# Patient Record
Sex: Female | Born: 1940 | Race: White | Hispanic: No | Marital: Married | Smoking: Former smoker
Health system: Southern US, Community
[De-identification: ages and names within clinical notes are randomized; demographics above are authoritative.]

## PROBLEM LIST (undated history)

## (undated) DIAGNOSIS — K635 Polyp of colon: Secondary | ICD-10-CM

## (undated) DIAGNOSIS — I480 Paroxysmal atrial fibrillation: Principal | ICD-10-CM

## (undated) HISTORY — DX: Paroxysmal atrial fibrillation: I48.0

## (undated) HISTORY — DX: Polyp of colon: K63.5

---

## 2002-01-05 ENCOUNTER — Encounter (INDEPENDENT_AMBULATORY_CARE_PROVIDER_SITE_OTHER): Payer: Self-pay

## 2002-01-05 ENCOUNTER — Ambulatory Visit (HOSPITAL_COMMUNITY): Admission: RE | Admit: 2002-01-05 | Discharge: 2002-01-05 | Payer: Self-pay | Admitting: Gastroenterology

## 2004-04-06 ENCOUNTER — Encounter: Admission: RE | Admit: 2004-04-06 | Discharge: 2004-04-06 | Payer: Self-pay | Admitting: Family Medicine

## 2004-04-06 ENCOUNTER — Other Ambulatory Visit: Admission: RE | Admit: 2004-04-06 | Discharge: 2004-04-06 | Payer: Self-pay | Admitting: Family Medicine

## 2005-07-18 ENCOUNTER — Other Ambulatory Visit: Admission: RE | Admit: 2005-07-18 | Discharge: 2005-07-18 | Payer: Self-pay | Admitting: Family Medicine

## 2006-02-13 ENCOUNTER — Encounter: Admission: RE | Admit: 2006-02-13 | Discharge: 2006-02-13 | Payer: Self-pay | Admitting: Family Medicine

## 2006-08-19 ENCOUNTER — Encounter: Admission: RE | Admit: 2006-08-19 | Discharge: 2006-08-19 | Payer: Self-pay | Admitting: Family Medicine

## 2006-11-21 ENCOUNTER — Other Ambulatory Visit: Admission: RE | Admit: 2006-11-21 | Discharge: 2006-11-21 | Payer: Self-pay | Admitting: Family Medicine

## 2007-02-17 ENCOUNTER — Encounter: Admission: RE | Admit: 2007-02-17 | Discharge: 2007-02-17 | Payer: Self-pay | Admitting: Family Medicine

## 2008-01-05 ENCOUNTER — Encounter: Admission: RE | Admit: 2008-01-05 | Discharge: 2008-01-05 | Payer: Self-pay | Admitting: Family Medicine

## 2008-03-26 ENCOUNTER — Other Ambulatory Visit: Admission: RE | Admit: 2008-03-26 | Discharge: 2008-03-26 | Payer: Self-pay | Admitting: Family Medicine

## 2008-05-09 ENCOUNTER — Encounter: Admission: RE | Admit: 2008-05-09 | Discharge: 2008-05-09 | Payer: Self-pay | Admitting: Orthopedic Surgery

## 2010-02-09 ENCOUNTER — Emergency Department (HOSPITAL_COMMUNITY): Admission: EM | Admit: 2010-02-09 | Discharge: 2010-02-09 | Payer: Self-pay | Admitting: Emergency Medicine

## 2010-07-05 LAB — GLUCOSE, CAPILLARY: Glucose-Capillary: 91 mg/dL (ref 70–99)

## 2010-09-08 NOTE — Op Note (Signed)
   NAME:  Amber Mccarthy, Amber Mccarthy                      ACCOUNT NO.:  [id number]   MEDICAL RECORD NO.:  [id number]                   PATIENT TYPE:  AMB   LOCATION:  ENDO                                 FACILITY:  [facility]   PHYSICIAN:  Barrie Folk, M.D.                  DATE OF BIRTH:  09/10/1940   DATE OF PROCEDURE:  01/05/2002  DATE OF DISCHARGE:                                 OPERATIVE REPORT   PROCEDURE:  Colonoscopy with polypectomy.   INDICATIONS FOR PROCEDURE:  Family history of colon cancer.   DESCRIPTION OF PROCEDURE:  The patient was placed in the left lateral  decubitus position and placed on the pulse monitor, with continuous low-flow  oxygen delivered by nasal cannula.  She was sedated with 70 mg IV Demerol  and  7.5 mg IV Versed.  The Olympus video colonoscope was inserted into the  rectum and advanced to the cecum, confirmed by transillumination of  McBurney's point and visualization of the ileocecal valve and appendiceal  orifice.  The prep was excellent.  The cecum, ascending, transverse,  descending colon all appeared normal -- with no masses, polyps, diverticula  or other mucosal abnormalities.  In the sigmoid colon there were a few  scattered diverticula.  Within the rectum there was a 1 cm polyp that was  removed by hot biopsy.  The rectum likewise appeared normal.  In retroflex  view the anus revealed no obvious internal hemorrhoids.  The colonoscope was  then withdrawn and the patient returned to the recovery room in stable  condition.  She tolerated the procedure well and there were no immediate  complications.   IMPRESSION:  1. Rectal polyp.  2. Sigmoid diverticula.   PLAN:  Await histology for determination of interval for next colonoscopy.                                                Barrie Folk, M.D.    JCH/MEDQ  D:  01/05/2002  T:  01/05/2002  Job:  [id number]   cc:   Duncan Dull, M.D.  [address]  [place]  [place] [address]  Fax:  [phone]

## 2014-09-22 DIAGNOSIS — I480 Paroxysmal atrial fibrillation: Secondary | ICD-10-CM

## 2014-09-22 HISTORY — DX: Paroxysmal atrial fibrillation: I48.0

## 2014-10-01 ENCOUNTER — Encounter (HOSPITAL_COMMUNITY): Payer: Self-pay | Admitting: *Deleted

## 2014-10-01 ENCOUNTER — Emergency Department (HOSPITAL_COMMUNITY)
Admission: EM | Admit: 2014-10-01 | Discharge: 2014-10-01 | Disposition: A | Discharge status: Home / Self Care | Payer: 59 | Attending: Emergency Medicine | Admitting: Emergency Medicine

## 2014-10-01 DIAGNOSIS — R42 Dizziness and giddiness: Secondary | ICD-10-CM | POA: Diagnosis not present

## 2014-10-01 DIAGNOSIS — R110 Nausea: Secondary | ICD-10-CM | POA: Insufficient documentation

## 2014-10-01 DIAGNOSIS — I4891 Unspecified atrial fibrillation: Secondary | ICD-10-CM | POA: Diagnosis not present

## 2014-10-01 DIAGNOSIS — R630 Anorexia: Secondary | ICD-10-CM | POA: Diagnosis not present

## 2014-10-01 DIAGNOSIS — R000 Tachycardia, unspecified: Secondary | ICD-10-CM | POA: Diagnosis present

## 2014-10-01 DIAGNOSIS — R5383 Other fatigue: Secondary | ICD-10-CM | POA: Diagnosis not present

## 2014-10-01 LAB — CBC WITH DIFFERENTIAL/PLATELET
BASOS ABS: 0 10*3/uL (ref 0.0–0.1)
Basophils Relative: 0 % (ref 0–1)
Eosinophils Absolute: 0.2 10*3/uL (ref 0.0–0.7)
Eosinophils Relative: 3 % (ref 0–5)
HCT: 41.1 % (ref 36.0–46.0)
Hemoglobin: 14.1 g/dL (ref 12.0–15.0)
Lymphocytes Relative: 33 % (ref 12–46)
Lymphs Abs: 2 10*3/uL (ref 0.7–4.0)
MCH: 32.1 pg (ref 26.0–34.0)
MCHC: 34.3 g/dL (ref 30.0–36.0)
MCV: 93.6 fL (ref 78.0–100.0)
Monocytes Absolute: 0.4 10*3/uL (ref 0.1–1.0)
Monocytes Relative: 7 % (ref 3–12)
Neutro Abs: 3.6 10*3/uL (ref 1.7–7.7)
Neutrophils Relative %: 57 % (ref 43–77)
Platelets: 221 10*3/uL (ref 150–400)
RBC: 4.39 MIL/uL (ref 3.87–5.11)
RDW: 12.2 % (ref 11.5–15.5)
WBC: 6.3 10*3/uL (ref 4.0–10.5)

## 2014-10-01 LAB — URINALYSIS, ROUTINE W REFLEX MICROSCOPIC
Bilirubin Urine: NEGATIVE
GLUCOSE, UA: NEGATIVE mg/dL
Hgb urine dipstick: NEGATIVE
Ketones, ur: NEGATIVE mg/dL
Leukocytes, UA: NEGATIVE
Nitrite: NEGATIVE
Protein, ur: NEGATIVE mg/dL
Specific Gravity, Urine: 1.004 — ABNORMAL LOW (ref 1.005–1.030)
Urobilinogen, UA: 0.2 mg/dL (ref 0.0–1.0)
pH: 7.5 (ref 5.0–8.0)

## 2014-10-01 LAB — COMPREHENSIVE METABOLIC PANEL
ALBUMIN: 3.8 g/dL (ref 3.5–5.0)
ALT: 20 U/L (ref 14–54)
AST: 31 U/L (ref 15–41)
Alkaline Phosphatase: 59 U/L (ref 38–126)
Anion gap: 10 (ref 5–15)
BUN: 24 mg/dL — AB (ref 6–20)
CHLORIDE: 107 mmol/L (ref 101–111)
CO2: 25 mmol/L (ref 22–32)
Calcium: 9.1 mg/dL (ref 8.9–10.3)
Creatinine, Ser: 0.84 mg/dL (ref 0.44–1.00)
GFR calc Af Amer: >60 mL/min (ref >60)
GFR calc non Af Amer: >60 mL/min (ref >60)
Glucose, Bld: 108 mg/dL — ABNORMAL HIGH (ref 65–99)
POTASSIUM: 3.7 mmol/L (ref 3.5–5.1)
Sodium: 142 mmol/L (ref 135–145)
Total Bilirubin: 0.9 mg/dL (ref 0.3–1.2)
Total Protein: 6 g/dL — ABNORMAL LOW (ref 6.5–8.1)

## 2014-10-01 LAB — TROPONIN I: Troponin I: <0.03 ng/mL (ref <0.031)

## 2014-10-01 LAB — TSH: TSH: 3.114 u[IU]/mL (ref 0.350–4.500)

## 2014-10-01 MED ORDER — ASPIRIN 81 MG PO CHEW: 81.0000 mg | CHEWABLE_TABLET | Freq: Every day | ORAL | Status: DC

## 2014-10-01 NOTE — ED Notes (Signed)
Pt reports increased stress over the past week. 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

## 2014-10-01 NOTE — ED Notes (Signed)
Pt c/o tachycardia; denies history. Pt was lying in bed around 2230; felt her heart racing, unable to catch her breath, and a nauseated feeling. Pt denies any medical history. EMS EKG strips showed rate of 163 in afib rvr; pt noted to be in NSR in the 80s. Denies chest pain; c/o feeling tired and dry mouth. Water given per MD

## 2014-10-01 NOTE — Discharge Instructions (Signed)
Call and make an appointment to follow-up with a cardiologist. Take a baby aspirin daily. Return immediately for worsening palpitations, chest pain, difficulty breathing, lightheadedness or for any concerns.  Atrial Fibrillation Atrial fibrillation is a type of irregular heart rhythm (arrhythmia). During atrial fibrillation, the upper chambers of the heart (atria) quiver continuously in a chaotic pattern. This causes an irregular and often rapid heart rate.  Atrial fibrillation is the result of the heart becoming overloaded with disorganized signals that tell it to beat. These signals are normally released one at a time by a part of the right atrium called the sinoatrial node. They then travel from the atria to the lower chambers of the heart (ventricles), causing the atria and ventricles to contract and pump blood as they pass. In atrial fibrillation, parts of the atria outside of the sinoatrial node also release these signals. This results in two problems. First, the atria receive so many signals that they do not have time to fully contract. Second, the ventricles, which can only receive one signal at a time, beat irregularly and out of rhythm with the atria.  There are three types of atrial fibrillation:   Paroxysmal. Paroxysmal atrial fibrillation starts suddenly and stops on its own within a week.  Persistent. Persistent atrial fibrillation lasts for more than a week. It may stop on its own or with treatment.  Permanent. Permanent atrial fibrillation does not go away. Episodes of atrial fibrillation may lead to permanent atrial fibrillation. Atrial fibrillation can prevent your heart from pumping blood normally. It increases your risk of stroke and can lead to heart failure.  CAUSES   Heart conditions, including a heart attack, heart failure, coronary artery disease, and heart valve conditions.   Inflammation of the sac that surrounds the heart (pericarditis).  Blockage of an artery in the  lungs (pulmonary embolism).  Pneumonia or other infections.  Chronic lung disease.  Thyroid problems, especially if the thyroid is overactive (hyperthyroidism).  Caffeine, excessive alcohol use, and use of some illegal drugs.   Use of some medicines, including certain decongestants and diet pills.  Heart surgery.   Birth defects.  Sometimes, no cause can be found. When this happens, the atrial fibrillation is called lone atrial fibrillation. The risk of complications from atrial fibrillation increases if you have lone atrial fibrillation and you are age 46 years or older. RISK FACTORS  Heart failure.  Coronary artery disease.  Diabetes mellitus.   High blood pressure (hypertension).   Obesity.   Other arrhythmias.   Increased age. SIGNS AND SYMPTOMS   A feeling that your heart is beating rapidly or irregularly.   A feeling of discomfort or pain in your chest.   Shortness of breath.   Sudden light-headedness or weakness.   Getting tired easily when exercising.   Urinating more often than normal (mainly when atrial fibrillation first begins).  In paroxysmal atrial fibrillation, symptoms may start and suddenly stop. DIAGNOSIS  Your health care provider may be able to detect atrial fibrillation when taking your pulse. Your health care provider may have you take a test called an ambulatory electrocardiogram (ECG). An ECG records your heartbeat patterns over a 24-hour period. You may also have other tests, such as:  Transthoracic echocardiogram (TTE). During echocardiography, sound waves are used to evaluate how blood flows through your heart.  Transesophageal echocardiogram (TEE).  Stress test. There is more than one type of stress test. If a stress test is needed, ask your health care provider about which  type is best for you.  Chest X-ray exam.  Blood tests.  Computed tomography (CT). TREATMENT  Treatment may include:  Treating any underlying  conditions. For example, if you have an overactive thyroid, treating the condition may correct atrial fibrillation.  Taking medicine. Medicines may be given to control a rapid heart rate or to prevent blood clots, heart failure, or a stroke.  Having a procedure to correct the rhythm of the heart:  Electrical cardioversion. During electrical cardioversion, a controlled, low-energy shock is delivered to the heart through your skin. If you have chest pain, very low blood pressure, or sudden heart failure, this procedure may need to be done as an emergency.  Catheter ablation. During this procedure, heart tissues that send the signals that cause atrial fibrillation are destroyed.  Surgical ablation. During this surgery, thin lines of heart tissue that carry the abnormal signals are destroyed. This procedure can either be an open-heart surgery or a minimally invasive surgery. With the minimally invasive surgery, small cuts are made to access the heart instead of a large opening.  Pulmonary venous isolation. During this surgery, tissue around the veins that carry blood from the lungs (pulmonary veins) is destroyed. This tissue is thought to carry the abnormal signals. HOME CARE INSTRUCTIONS   Take medicines only as directed by your health care provider. Some medicines can make atrial fibrillation worse or recur.  If blood thinners were prescribed by your health care provider, take them exactly as directed. Too much blood-thinning medicine can cause bleeding. If you take too little, you will not have the needed protection against stroke and other problems.  Perform blood tests at home if directed by your health care provider. Perform blood tests exactly as directed.  Quit smoking if you smoke.  Do not drink alcohol.  Do not drink caffeinated beverages such as coffee, soda, and some teas. You may drink decaffeinated coffee, soda, or tea.   Maintain a healthy weight.Do not use diet pills unless  your health care provider approves. They may make heart problems worse.   Follow diet instructions as directed by your health care provider.  Exercise regularly as directed by your health care provider.  Keep all follow-up visits as directed by your health care provider. This is important. PREVENTION  The following substances can cause atrial fibrillation to recur:   Caffeinated beverages.  Alcohol.  Certain medicines, especially those used for breathing problems.  Certain herbs and herbal medicines, such as those containing ephedra or ginseng.  Illegal drugs, such as cocaine and amphetamines. Sometimes medicines are given to prevent atrial fibrillation from recurring. Proper treatment of any underlying condition is also important in helping prevent recurrence.  SEEK MEDICAL CARE IF:  You notice a change in the rate, rhythm, or strength of your heartbeat.  You suddenly begin urinating more frequently.  You tire more easily when exerting yourself or exercising. SEEK IMMEDIATE MEDICAL CARE IF:   You have chest pain, abdominal pain, sweating, or weakness.  You feel nauseous.  You have shortness of breath.  You suddenly have swollen feet and ankles.  You feel dizzy.  Your face or limbs feel numb or weak.  You have a change in your vision or speech. MAKE SURE YOU:   Understand these instructions.  Will watch your condition.  Will get help right away if you are not doing well or get worse. Document Released: 04/09/2005 Document Revised: 08/24/2013 Document Reviewed: 05/20/2012 [facility] Patient Information 2015 [place], [place]. This information is not intended  to replace advice given to you by your health care provider. Make sure you discuss any questions you have with your health care provider. ° °

## 2014-10-01 NOTE — ED Provider Notes (Signed)
CSN: [id number]     Arrival date & time 10/01/14  0038 History  This chart was scribed for Amber Racer, MD by Amber Mccarthy, ED Scribe. This patient was seen in room A01C/A01C and the patient's care was started at 12:47 AM.   Chief Complaint  Patient presents with  . Tachycardia    The history is provided by the patient. No language interpreter was used.     HPI Comments: Amber Mccarthy is a 74 y.o. female brought in by ambulance, who presents to the Emergency Department complaining of sudden onset palpitations that began approximately 2 hours ago, shortly after going to bed at 2230. Described the palpitations as rapid heartbeat. Patient has tried switching positions in bed, in addition to ambulating without relief. There is associated dry mouth, light headedness, dizziness, and nausea; all of which have resolved at this time. Patient states she is very active and athletic and denies history of prior similar episodes. Patient has recently been working 9-10 hour days and has not been eating well and feels dehydrated. HR was 129 upon arrival of EMS. No chest pain or shortness of breath.   History reviewed. No pertinent past medical history. Past Surgical History  Procedure Laterality Date  . Cesarean section     History reviewed. No pertinent family history. History  Substance Use Topics  . Smoking status: Never Smoker   . Smokeless tobacco: Not on file  . Alcohol Use: Yes   OB History    No data available     Review of Systems  Constitutional: Positive for fatigue. Negative for fever and chills.  Respiratory: Negative for cough and shortness of breath.   Cardiovascular: Positive for palpitations. Negative for chest pain and leg swelling.  Gastrointestinal: Positive for nausea. Negative for vomiting, abdominal pain and diarrhea.  Genitourinary: Negative for dysuria.  Musculoskeletal: Negative for back pain, neck pain and neck stiffness.  Skin: Negative for rash and  wound.  Neurological: Positive for dizziness and light-headedness. Negative for speech difficulty, weakness and headaches.  All other systems reviewed and are negative.     Allergies  Review of patient's allergies indicates no known allergies.  Home Medications   Prior to Admission medications   Medication Sig Start Date End Date Taking? Authorizing Provider  Nutritional Supplements (ESTRONATURAL PO) Take 1 tablet by mouth at bedtime.   Yes Historical Provider, MD  aspirin 81 MG chewable tablet Chew 1 tablet (81 mg total) by mouth daily. 10/01/14   Amber Racer, MD   BP 124/62 mmHg  Pulse 68  Temp(Src) 98.4 F (36.9 C) (Oral)  Resp 16  Ht  (1.651 m)  Wt 140 lb (63.504 kg)  BMI 23.30 kg/m2  SpO2 97% Physical Exam  Constitutional: She is oriented to person, place, and time. She appears well-developed and well-nourished. No distress.  HENT:  Head: Normocephalic and atraumatic.  Mouth/Throat: Oropharynx is clear and moist. No oropharyngeal exudate.  Eyes: EOM are normal. Pupils are equal, round, and reactive to light.  Neck: Normal range of motion. Neck supple. No thyromegaly present.  Cardiovascular: Normal rate and regular rhythm.  Exam reveals no gallop and no friction rub.   No murmur heard. Pulmonary/Chest: Effort normal and breath sounds normal. No respiratory distress. She has no wheezes. She has no rales. She exhibits no tenderness.  Abdominal: Soft. Bowel sounds are normal. She exhibits no distension and no mass. There is no tenderness. There is no rebound and no guarding.  Musculoskeletal: Normal range of motion.  She exhibits no edema or tenderness.  No calf swelling or tenderness.  Neurological: She is alert and oriented to person, place, and time.  5/5 motor in all extremities. Sensation is fully intact.  Skin: Skin is warm and dry. No rash noted. No erythema.  Psychiatric: She has a normal mood and affect. Her behavior is normal.  Nursing note and vitals  reviewed.   ED Course  Procedures (including critical care time)  DIAGNOSTIC STUDIES: Oxygen Saturation is 98% on room air, normal by my interpretation.    COORDINATION OF CARE:   Labs Review Labs Reviewed  COMPREHENSIVE METABOLIC PANEL - Abnormal; Notable for the following:    Glucose, Bld 108 (*)    BUN 24 (*)    Total Protein 6.0 (*)    All other components within normal limits  URINALYSIS, ROUTINE W REFLEX MICROSCOPIC (NOT AT [facility]) - Abnormal; Notable for the following:    Specific Gravity, Urine 1.004 (*)    All other components within normal limits  CBC WITH DIFFERENTIAL/PLATELET  TROPONIN I  TSH    Imaging Review No results found.   EKG Interpretation   Date/Time:  Friday October 01 2014 00:56:51 EDT Ventricular Rate:  87 PR Interval:  141 QRS Duration: 79 QT Interval:  354 QTC Calculation: 426 R Axis:   64 Text Interpretation:  Age not entered, assumed to be  74 years old for  purpose of ECG interpretation Sinus rhythm Borderline T abnormalities,  inferior leads Confirmed by Amber Palms  MD, Amber Mccarthy ([id number]) on 10/01/2014  3:15:24 AM      MDM   Final diagnoses:  New onset atrial fibrillation    I personally performed the services described in this documentation, which was scribed in my presence. The recorded information has been reviewed and is accurate.  EMS run sheet showed atrial fibrillation with RVR. Patient is obviously converted to normal sinus in the emergency department. She's been observed for several hours and her symptoms completely resolved. At no point did she have any chest pressure or pain. She has CHA2DS2-VASc score of 2 for age and gender.  She is low risk for stroke and briefly discussed anticoagulation with her. Will start on baby aspirin and have her follow-up with the cardiologist. She's been given return precautions and is voiced understanding.  Amber Racer, MD 10/01/14 [phone]

## 2014-10-01 NOTE — ED Notes (Signed)
Pt. Left with all belongings and refused wheelchair 

## 2014-10-01 NOTE — ED Notes (Signed)
Pt arrived via EMS c/o tachycardia, SOB, and dry mouth.  EMS reported highest heart rate was in the 160's.  Pt reports has no medical hx, takes no prescription meds at home.

## 2014-10-04 DIAGNOSIS — I480 Paroxysmal atrial fibrillation: Secondary | ICD-10-CM | POA: Insufficient documentation

## 2014-10-04 NOTE — Progress Notes (Signed)
Cardiology Office Note   Date:  10/05/2014   ID:  Amber Mccarthy, DOB 30-Aug-1940, MRN [id number]  PCP:  Marjorie Smolder, MD  Cardiologist:  New - Dr. Sherren Mocha     Chief Complaint  Patient presents with  . Hospitalization Follow-up    Atrial Fibrillation; ED visit     History of Present Illness: Amber Mccarthy is a 74 y.o. female who presented to the ED on 10/01/14 with palpitations. Notes from the emergency room physician suggested there was atrial fibrillation noted on rhythm strip during her ride to the hospital on the ambulance. ECG in the ED demonstrated normal sinus rhythm.  CEs were neg.  TSH was normal.  She presents for further cardiac evaluation.     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.  She is now retired. She is very healthy and is quite active. She exercises weekly.  She does Yoga, Tai-Chi, swimming and weight training. She denies any hx of chest pain or significant DOE. She denies orthopnea, PND, edema. She denies syncope.  She has occasional palpitations but nothing like she did the day she went to the ED.  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   She was not eating well and not hydrating well.  She denies OTC medication use.  She drinks 1 glass of wine per night.  She has a remote hx of smoking.  No significant FHx of CAD. She denies a hx of Diabetes, HTN, prior CVA or CHF.    Studies/Reports Reviewed Today:  None   Past Medical History  Diagnosis Date  . PAF (paroxysmal atrial fibrillation) 09/2014  .  Colon polyps     Past Surgical History  Procedure Laterality Date  . Cesarean section      Current Outpatient Prescriptions  Medication Sig Dispense Refill  . aspirin 81 MG chewable tablet Chew 1 tablet (81 mg total) by mouth daily. 30 tablet 0  . Nutritional Supplements (ESTRONATURAL PO) Take 1 tablet by mouth at bedtime.    . metoprolol tartrate (LOPRESSOR) 25 MG tablet Take 0.5 tablets (12.5 mg total) by mouth daily as needed (FOR PALPITATIONS). 60 tablet 11   No current facility-administered medications for this visit.    Allergies:   Review of patient's allergies indicates no known allergies.    Social History:  The patient  reports that she has quit smoking. She has never used smokeless tobacco. She reports that she drinks about 4.2 oz of alcohol per week. She reports that she does not use illicit drugs.   Family History:  The patient's family history includes Alzheimer's disease in her father; Cancer in her mother.    ROS:   Please see the history of present illness.   Review of Systems  Cardiovascular: Positive for dyspnea on exertion and irregular heartbeat.  All other systems reviewed and are negative.    PHYSICAL EXAM: VS:  BP 120/60 mmHg  Pulse 89  Ht 5' 5" (1.651 m)  Wt 139 lb 1.9 oz (63.104 kg)  BMI 23.15 kg/m2    Wt Readings from Last 3 Encounters:  10/05/14 139 lb 1.9 oz (63.104 kg)  10/01/14 140 lb (63.504 kg)     GEN: Well nourished, well developed, in no acute distress HEENT: normal Neck: no JVD, no carotid bruits, no masses Cardiac:  Normal S1/S2, RRR; no murmur ,  no rubs or gallops, no edema   Respiratory:  clear to auscultation bilaterally, no wheezing, rhonchi or rales. GI: soft, nontender, nondistended, + BS MS: no deformity or atrophy Skin: warm and dry  Neuro:  CNs II-XII intact, Strength and sensation are intact Psych: Normal affect   EKG:  EKG is ordered today.  It demonstrates:   NSR, HR 89, normal axis ECG from EMS on 10/01/14:  AFib, HR 163  Recent Labs: 10/01/2014: ALT 20; BUN 24*; Creatinine, Ser 0.84; Hemoglobin 14.1; Platelets 221; Potassium 3.7; Sodium 142; TSH 3.114    Lipid Panel No results found for: CHOL, TRIG, HDL, CHOLHDL, VLDL, LDLCALC, LDLDIRECT    ASSESSMENT AND PLAN:  PAF (paroxysmal atrial fibrillation):  She presents for evaluation after a lone episode of AFib with RVR.  She was very symptomatic with this episode.  She has been a little weak since that time, but is feeling better.  I spent > 20 minutes with the patient and her husband today discussing the pathophysiology and management options for Atrial Fibrillation.  CHADS2-VASc=2 representing annual stroke risk of 2.2%.  TSH was normal.  There are no specific triggers identified for this episode of AFib.  It is possible that her fatigue and dehydration may have contributed. Technically, she would be a candidate for anticoagulation. However, at this point, I am not convinced she should be started on long term anticoagulation just yet.  Continue ASA for now.  I will give her Metoprolol Tartrate 25 mg take 1/2 QD as needed for rapid palpitations.  I will arrange an Echocardiogram to rule out structural heart disease.   We discussed arranging a 30 day monitor to assess for recurrent AFib and AF burden.  However, she is going to be traveling quite a bit and does not feel that she can comply with wearing it the entire 30 days.  We will therefore arrange a 48 hour Holter.  Question if she may be a candidate for GENETIC AF trial.  I reviewed her case with Dr. Michael Cooper today.  He also met with the patient.  We will have her follow up in the AFib Clinic to make further recommendations, if any.       Current medicines are reviewed at length with the patient today.  Concerns regarding medicines are as outlined above.  The following changes have been made:    As above  Labs/ tests ordered today include:   Orders Placed This Encounter  Procedures  . Holter  monitor - 48 hour  . EKG 12-Lead  . Echocardiogram    Disposition:   Refer to Atrial Fibrillation Clinic.  She can follow up after her monitor is complete.    Signed, Scott Weaver, PA-C, MHS 10/05/2014 4:10 PM    [facility] [address], [place], [place]  [address] Phone: [phone]; Fax: [phone]    

## 2014-10-05 ENCOUNTER — Ambulatory Visit (INDEPENDENT_AMBULATORY_CARE_PROVIDER_SITE_OTHER): Payer: 59 | Admitting: Physician Assistant

## 2014-10-05 ENCOUNTER — Encounter: Payer: Self-pay | Admitting: Physician Assistant

## 2014-10-05 VITALS — BP 120/60 | HR 89 | Ht 65.0 in | Wt 139.1 lb

## 2014-10-05 DIAGNOSIS — R002 Palpitations: Secondary | ICD-10-CM

## 2014-10-05 DIAGNOSIS — I480 Paroxysmal atrial fibrillation: Secondary | ICD-10-CM

## 2014-10-05 MED ORDER — METOPROLOL TARTRATE 25 MG PO TABS: 12.5000 mg | ORAL_TABLET | Freq: Two times a day (BID) | ORAL | Status: DC

## 2014-10-05 MED ORDER — METOPROLOL TARTRATE 25 MG PO TABS
12.5000 mg | ORAL_TABLET | Freq: Every day | ORAL | Status: DC | PRN
Start: 2014-10-05 — End: 2017-09-09

## 2014-10-05 NOTE — Patient Instructions (Addendum)
Medication Instructions:  1. START METOPROLOL TARTRATE 25 MG TAB; DIRECTIONS TAKE 1/2 TAB= 12.5 MG DAILY AS NEEDED  Labwork: NONE  Testing/Procedures: Your physician has requested that you have an echocardiogram. Echocardiography is a painless test that uses sound waves to create images of your heart. It provides your doctor with information about the size and shape of your heart and how well your heart's chambers and valves are working. This procedure takes approximately one hour. There are no restrictions for this procedure.  Your physician has recommended that you wear a 48 HOUR holter monitor. Holter monitors are medical devices that record the heart's electrical activity. Doctors most often use these monitors to diagnose arrhythmias. Arrhythmias are problems with the speed or rhythm of the heartbeat. The monitor is a small, portable device. You can wear one while you do your normal daily activities. This is usually used to diagnose what is causing palpitations/syncope (passing out).  Follow-Up: YOU ARE BEING REFERRED TO A-FIB CLINIC FOR APPT IN 6 WEEKS  Any Other Special Instructions Will Be Listed Below (If Applicable).

## 2014-10-07 ENCOUNTER — Ambulatory Visit (INDEPENDENT_AMBULATORY_CARE_PROVIDER_SITE_OTHER): Payer: Medicare Other

## 2014-10-07 ENCOUNTER — Encounter: Payer: Self-pay | Admitting: Physician Assistant

## 2014-10-07 ENCOUNTER — Other Ambulatory Visit: Payer: Self-pay

## 2014-10-07 ENCOUNTER — Ambulatory Visit (HOSPITAL_COMMUNITY): Payer: Medicare Other | Attending: Internal Medicine

## 2014-10-07 DIAGNOSIS — I480 Paroxysmal atrial fibrillation: Secondary | ICD-10-CM | POA: Diagnosis not present

## 2014-10-08 ENCOUNTER — Telehealth: Payer: Self-pay | Admitting: Physician Assistant

## 2014-10-08 NOTE — Telephone Encounter (Signed)
Results of echo given to patient as specified per [facility].  Understands.  No further questions

## 2014-10-08 NOTE — Telephone Encounter (Signed)
New problem    Pt returning call from yesterday concerning lab results.

## 2014-10-13 ENCOUNTER — Telehealth: Payer: Self-pay | Admitting: Physician Assistant

## 2014-10-13 NOTE — Telephone Encounter (Signed)
Holter Monitor Results:   Interpretation Summary     CONCLUSIONS:  THE BASIC RHYTHM IS SINUS  THERE ARE RARE PVC'S  THERE ARE SHORT RUNS OF CONSECUTIVE SUPRAVENTRICULAR BEATS OF 4-5 BEATS  NO SUSTAINED ARRHYTHMIA OR PATHOLOGIC PAUSES   Please tell patient that monitor demonstrated no AFib.  There were a few rapid beats but these were very brief (4-5 beats).  There is nothing to do for this.   Continue with current treatment plan. Tereso Newcomer, PA-C   10/13/2014 4:30 PM

## 2014-10-15 ENCOUNTER — Telehealth: Payer: Self-pay | Admitting: *Deleted

## 2014-10-15 NOTE — Telephone Encounter (Signed)
Pt notified of monitor resy=ults today by phone w/verbal understanding to results given. Pt advised to keep her appt with the A-fib clinic in July. Pt agreeable to plan of care.

## 2014-10-15 NOTE — Telephone Encounter (Signed)
Pt notified of monitor resy=ults today by phone w/verbal understanding to results given. Pt advised to keep her appt with the A-fib clinic in July. Pt agreeable to plan of care. 

## 2014-11-17 ENCOUNTER — Encounter: Payer: Self-pay | Admitting: Internal Medicine

## 2014-11-17 ENCOUNTER — Ambulatory Visit (INDEPENDENT_AMBULATORY_CARE_PROVIDER_SITE_OTHER): Payer: Medicare Other | Admitting: Internal Medicine

## 2014-11-17 VITALS — BP 126/74 | HR 88 | Ht 65.5 in | Wt 140.6 lb

## 2014-11-17 DIAGNOSIS — I480 Paroxysmal atrial fibrillation: Secondary | ICD-10-CM

## 2014-11-17 MED ORDER — APIXABAN 5 MG PO TABS: 5.0000 mg | ORAL_TABLET | Freq: Two times a day (BID) | ORAL | Status: DC

## 2014-11-17 NOTE — Patient Instructions (Signed)
Medication Instructions:  Your physician has recommended you make the following change in your medication:  1) Start Eliquis 5 mg one by mouth twice daily    Labwork: None ordered  Testing/Procedures: None ordered  Follow-Up: Your physician recommends that you schedule a follow-up appointment in: 6 weeks with Dr [name]   Any Other Special Instructions Will Be Listed Below (If Applicable).

## 2014-11-17 NOTE — Progress Notes (Signed)
Electrophysiology Office Note   Date:  11/17/2014   ID:  Amber Mccarthy, DOB [birth year], MRN [id number]  PCP:  Marjorie Smolder, MD  Cardiologist:  Burt Knack Primary Electrophysiologist: Thompson Grayer, MD    Chief Complaint  Patient presents with  . PAF     History of Present Illness: Amber Mccarthy is a 74 y.o. female who presents today for electrophysiology evaluation.   DAIANA Mccarthy is a 74 y.o. female who presented to the ED on 10/01/14 with palpitations. Notes from the emergency room physician suggested there was atrial fibrillation noted on rhythm strip during her ride to the hospital on the ambulance. ECG in the ED demonstrated normal sinus rhythm. CEs were neg. TSH was normal. She presents for further cardiac evaluation.    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. She is now retired. She is very healthy and is quite active. She exercises weekly. She does Yoga, Tai-Chi, swimming and weight training. She denies any hx of chest pain or significant DOE. She denies orthopnea, PND, edema. She denies syncope. She has occasional palpitations but nothing like she did the day she went to the ED. 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 She was not eating well and not hydrating well. She denies OTC medication use. She drinks 1 glass of wine per night. She has a remote hx of smoking. No significant FHx of CAD. She denies a hx of Diabetes, HTN, prior CVA or CHF. She has had fatigue over the past month as well as several short episodes of irregular palpitations, likely due to afib.   Today, she denies symptoms of  chest pain, shortness of breath, orthopnea, PND, lower extremity edema, claudication, dizziness, presyncope, syncope, bleeding, or neurologic sequela. The patient is tolerating medications without difficulties and is otherwise without complaint today.     Past Medical History  Diagnosis Date  . PAF (paroxysmal atrial fibrillation) 09/2014    a.  Echo 6/16:  Vigorous LVF, EF 65-70%, no RWMA, Gr 1 DD  . Colon polyps    Past Surgical History  Procedure Laterality Date  . Cesarean section  1987     Current Outpatient Prescriptions  Medication Sig Dispense Refill  . aspirin 81 MG chewable tablet Chew 1 tablet (81 mg total) by mouth daily. 30 tablet 0  . CALCIUM-MAGNESIUM-VITAMIN D PO Take 2 capsules by mouth daily.     . Multiple Vitamin (MULTIVITAMIN) capsule Take 1 capsule by mouth daily. Ultra Women 50+    . omega-3 fish oil (MAXEPA) 1000 MG CAPS capsule Take 1 capsule by mouth daily.    . vitamin B-12 (CYANOCOBALAMIN) 1000 MCG tablet Take 1,000 mcg by mouth daily.    . metoprolol tartrate (LOPRESSOR) 25 MG tablet Take 0.5 tablets (12.5 mg total) by mouth daily as needed (FOR PALPITATIONS). (Patient not taking: Reported on 11/17/2014) 60 tablet 11   No current facility-administered medications for this visit.    Allergies:   Review of patient's allergies indicates no known allergies.   Social History:  The patient  reports that she has quit smoking. She has never used smokeless tobacco. She reports that she drinks about 4.2 oz of alcohol per week. She reports that she does not use illicit drugs.   Family History:  The patient's  family history includes Alzheimer's disease in her father; Cancer in her mother.  ROS:  Please see the history of present illness.   All other systems are reviewed and negative.    PHYSICAL EXAM: VS:  BP 126/74 mmHg  Pulse 88  Ht 5' 5.5" (1.664 m)  Wt 63.776 kg (140 lb 9.6 oz)  BMI 23.03 kg/m2 , BMI Body mass index is 23.03 kg/(m^2). GEN: Well nourished, well developed, in no acute distress HEENT: normal Neck: no JVD, carotid bruits, or masses Cardiac: RRR; no murmurs, rubs, or gallops,no edema  Respiratory:  clear to auscultation bilaterally, normal work of breathing GI: soft, nontender,  nondistended, + BS MS: no deformity or atrophy Skin: warm and dry  Neuro:  Strength and sensation are intact Psych: euthymic mood, full affect   Recent Labs: 10/01/2014: ALT 20; BUN 24*; Creatinine, Ser 0.84; Hemoglobin 14.1; Platelets 221; Potassium 3.7; Sodium 142; TSH 3.114    Lipid Panel  No results found for: CHOL, TRIG, HDL, CHOLHDL, VLDL, LDLCALC, LDLDIRECT   Wt Readings from Last 3 Encounters:  11/17/14 63.776 kg (140 lb 9.6 oz)  10/05/14 63.104 kg (139 lb 1.9 oz)  10/01/14 63.504 kg (140 lb)      Other studies Reviewed: Additional studies/ records that were reviewed today include: Marine scientist notes, Echo  Review of the above records today demonstrates: preserved EF holter reveals pvcs, short atrial runs,  No afib  ASSESSMENT AND PLAN:  1.  Atrial fibrillation chads2vasc score is 2.  Today, I discussed Coumadin as well as novel anticoagulants including Pradaxa, Xarelto, Savaysa, and Eliquis today as indicated for risk reduction in stroke and systemic emboli with nonvalvular atrial fibrillation.  Risks, benefits, and alternatives to each of these drugs were discussed at length today.  She would like to start eliquis. I will therefore start eliquis 58m BID.  Stop asa I would reserve AAD therapy for worsening symptoms. Regular activity is encouraged  Follow-up:  Return to see me in 6 weeks for further discussion  Current medicines are reviewed at length with the patient today.   The patient does not have concerns regarding her medicines.  The following changes were made today:  none   Signed, JThompson Grayer MD  11/17/2014 2:47 PM     [place] [phone](office) [phone](fax)

## 2014-11-19 ENCOUNTER — Telehealth: Payer: Self-pay

## 2014-11-19 NOTE — Telephone Encounter (Signed)
Prior auth for Eliquis  sent to [facility] via fax. Patient called and was advised of this.

## 2014-11-22 ENCOUNTER — Telehealth: Payer: Self-pay

## 2014-11-22 NOTE — Telephone Encounter (Signed)
Samples of Eliquis 5 mg given, 2 boxes while awaiting PA.

## 2014-11-23 ENCOUNTER — Telehealth: Payer: Self-pay

## 2014-11-23 NOTE — Telephone Encounter (Signed)
Eliquis approved through [facility], good for 1 year till 11/22/2015. PA [id number]. Local Pharmacy notified.

## 2014-11-25 ENCOUNTER — Ambulatory Visit (HOSPITAL_COMMUNITY)
Admission: RE | Admit: 2014-11-25 | Discharge: 2014-11-25 | Disposition: A | Discharge status: Home / Self Care | Payer: Medicare Other | Source: Ambulatory Visit | Attending: Nurse Practitioner | Admitting: Nurse Practitioner

## 2014-11-25 VITALS — BP 120/80 | HR 70 | Ht 65.0 in | Wt 141.8 lb

## 2014-11-25 DIAGNOSIS — Z87891 Personal history of nicotine dependence: Secondary | ICD-10-CM | POA: Insufficient documentation

## 2014-11-25 DIAGNOSIS — Z7902 Long term (current) use of antithrombotics/antiplatelets: Secondary | ICD-10-CM | POA: Insufficient documentation

## 2014-11-25 DIAGNOSIS — Z79899 Other long term (current) drug therapy: Secondary | ICD-10-CM | POA: Diagnosis not present

## 2014-11-25 DIAGNOSIS — R002 Palpitations: Secondary | ICD-10-CM | POA: Diagnosis not present

## 2014-11-25 DIAGNOSIS — I480 Paroxysmal atrial fibrillation: Secondary | ICD-10-CM | POA: Diagnosis present

## 2014-11-26 ENCOUNTER — Encounter (HOSPITAL_COMMUNITY): Payer: Self-pay | Admitting: Nurse Practitioner

## 2014-11-26 NOTE — Progress Notes (Signed)
Patient ID: Amber Mccarthy, female   DOB: 04/20/1941, 74 y.o.   MRN: [id number]     Primary Care Physician: Hollice Espy, MD Referring Physician: self referred Electrophysiologist: Dr. Raelyn Ensign is a 74 y.o. female with a h/o new onset afib with ER visit 6/10. She was aware of a very rapid heart rhythm around 160 bpm. EMS was called and per pt it was documented on EMS strips, however by the time she arrived at the ER, rhythm has returned to sinus. She recently saw Dr. [name] and was placed on DOAC,(chadsvasc score of 2) has prn BB to use for elevated heart rate.  She requested to be seen in the Afib clinic to discuss further symptoms she has been experiencing. She calls thies mini episodes and they feel much different than the afib. What she describes sounds more consistent with palpitations and a recent monitor did show rare PVC's and 4-5 beat runs of SVT. She is very concerned about these and is very upset that she considers herself healthy and cannot believe she is having heart issues. She is very much a holistic health person and does not like prescription drugs but has no qualms taking supplements. She assures me that her current supplements do not contain stimulants. She also recented stopped taking an OTC estrogen supplement because it contained black cohosh, contraindicated with DOAC and believes she is having more hot flashes this could  be contributing to palpitations. These episodes are very short lived 5 mins to 30 mins and if she gets her mind off them they will go away but they are very concerning and frightening to her.Seh ahs cut out caffeine and she use to drink a glass of wine a mnight and is trying to cut down. She is very active, regular exerciser, and in good shape. No noring.  Today, she denies symptoms of chest pain, shortness of breath, orthopnea, PND, lower extremity edema, dizziness, presyncope, syncope, or neurologic sequela. The patient is tolerating  medications without difficulties and is otherwise without complaint today.   Past Medical History  Diagnosis Date  . PAF (paroxysmal atrial fibrillation) 09/2014    a.  Echo 6/16:  Vigorous LVF, EF 65-70%, no RWMA, Gr 1 DD  . Colon polyps    Past Surgical History  Procedure Laterality Date  . Cesarean section  1987    Current Outpatient Prescriptions  Medication Sig Dispense Refill  . apixaban (ELIQUIS) 5 MG TABS tablet Take 1 tablet (5 mg total) by mouth 2 (two) times daily. 60 tablet 11  . CALCIUM-MAGNESIUM-VITAMIN D PO Take 2 capsules by mouth daily.     . Multiple Vitamin (MULTIVITAMIN) capsule Take 1 capsule by mouth daily. Ultra Women 50+    . omega-3 fish oil (MAXEPA) 1000 MG CAPS capsule Take 1 capsule by mouth daily.    . vitamin B-12 (CYANOCOBALAMIN) 1000 MCG tablet Take 1,000 mcg by mouth daily.    Marland Kitchen aspirin 81 MG chewable tablet Chew 1 tablet (81 mg total) by mouth daily. (Patient not taking: Reported on 11/25/2014) 30 tablet 0  . metoprolol tartrate (LOPRESSOR) 25 MG tablet Take 0.5 tablets (12.5 mg total) by mouth daily as needed (FOR PALPITATIONS). (Patient not taking: Reported on 11/17/2014) 60 tablet 11   No current facility-administered medications for this encounter.    No Known Allergies  History   Social History  . Marital Status: Married    Spouse Name: [name]  . Number of Children: 1  .  Years of Education: N/A   Occupational History  . speech pathologist     Retired from [employer]   Social History Main Topics  . Smoking status: Former Games developer  . Smokeless tobacco: Never Used     Comment: quit 1970s  . Alcohol Use: 4.2 oz/week    7 Standard drinks or equivalent per week     Comment: 1 glass of wine per night--> since afib she has decreased  . Drug Use: No  . Sexual Activity: Not on file   Other Topics Concern  . Not on file   Social History Narrative   Pt lives in [place] with spouse.  Retired.  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    Family History  Problem Relation Age of Onset  . Cancer Mother     angiosarcoma  . Alzheimer's disease Father     ROS- All systems are reviewed and negative except as per the HPI above  Physical Exam: Filed Vitals:   11/25/14 1532  BP: 120/80  Pulse: 70  Height: 5\' 5"  (1.651 m)  Weight: 141 lb 12.8 oz (64.32 kg)    GEN- The patient is well appearing, alert and oriented x 3 today.   Head- normocephalic, atraumatic Eyes-  Sclera clear, conjunctiva pink Ears- hearing intact Oropharynx- clear Neck- supple, no JVP Lymph- no cervical lymphadenopathy Lungs- Clear to ausculation bilaterally, normal work of breathing Heart- Regular rate and rhythm, no murmurs, rubs or gallops, PMI not laterally displaced GI- soft, NT, ND, + BS Extremities- no clubbing, cyanosis, or edema MS- no significant deformity or atrophy Skin- no rash or lesion Psych- euthymic mood, full affect Neuro- strength and sensation are intact  EKG- None today. Recent EKG's reviewed.  ECHO- Left ventricle: The cavity size was normal. Wall thickness was normal. Systolic function was vigorous. The estimated ejection fraction was in the range of 65% to 70%. Wall motion was normal; there were no regional wall motion abnormalities. Doppler parameters are consistent with abnormal left ventricular relaxation (grade 1 diastolic dysfunction). The E/e&' ratio is <8, suggesting normal LV filling pressure. - Mitral valve: Structurally normal valve. There was trivial regurgitation. - Left atrium: The atrium was normal in size.  Impressions:  - LVEF 65-70%, normal wall thickness, normal wall motion, diastolic dysfunction, normal LV filling pressure, normal LA size.   Monitor    THE BASIC RHYTHM IS SINUS  THERE ARE RARE PVC'S  THERE ARE SHORT RUNS OF CONSECUTIVE SUPRAVENTRICULAR BEATS OF 4-5 BEATS NO SUSTAINED ARRHYTHMIA OR PATHOLOGIC PAUSES  Epic records  reviewed  Assessment and Plan: 1. New onset PAF Has not reoccurred Has BB as pill in pocket which pt has not needed. Continue DOAC for chadsvasc score of 2.  2. Palpitations Pt reassured that these are annoying but benign and carry no long term risk. I think pt has become very cardiac aware since the episode of afib and has bought a fitbit and is watching her heart rate constantly. She was probably having premature contractions in the past but never paid any attention to them. She was  offered  low dose BB daily but she would like to avoid . She was encouraged to try OTC magnesium 200 mg a day,  which fits with her philosophy of natural medicine and is excited to try this.  I spent 30 mins with pt discussing her concerns.  She has a f/u with Dr. [name] 9/14 and can be seen here as needed.  Geroge Baseman Susa Bones, [facility] [address] [place], [place] [address] [phone]

## 2015-01-05 ENCOUNTER — Encounter: Payer: Self-pay | Admitting: Internal Medicine

## 2015-01-05 ENCOUNTER — Ambulatory Visit (INDEPENDENT_AMBULATORY_CARE_PROVIDER_SITE_OTHER): Payer: Medicare Other | Admitting: Internal Medicine

## 2015-01-05 VITALS — BP 108/70 | HR 73 | Ht 65.0 in | Wt 143.0 lb

## 2015-01-05 DIAGNOSIS — I480 Paroxysmal atrial fibrillation: Secondary | ICD-10-CM | POA: Diagnosis not present

## 2015-01-05 NOTE — Patient Instructions (Signed)
Medication Instructions:  Your physician recommends that you continue on your current medications as directed. Please refer to the Current Medication list given to you today.   Labwork: None ordered  Testing/Procedures: None ordered  Follow-Up: Your physician wants you to follow-up in: 6 months with Dr Allred You will receive a reminder letter in the mail two months in advance. If you don't receive a letter, please call our office to schedule the follow-up appointment.     Any Other Special Instructions Will Be Listed Below (If Applicable).   

## 2015-01-07 NOTE — Progress Notes (Signed)
Electrophysiology Office Note   Date:  01/07/2015   ID:  AZAIAH MELLO, DOB [birth year], MRN [id number]  PCP:  Hollice Espy, MD  Cardiologist:  Excell Seltzer Primary Electrophysiologist: Hillis Range, MD    Chief Complaint  Patient presents with  . Palpitations  . PAF     History of Present Illness: Amber Mccarthy is a 74 y.o. female who presents today for electrophysiology evaluation.   The patient is doing well.  She has had no further afib.  . No significant FHx of CAD. She denies a hx of Diabetes, HTN, prior CVA or CHF. Her fatigue is improved. Today, she denies symptoms of  chest pain, shortness of breath, orthopnea, PND, lower extremity edema, claudication, dizziness, presyncope, syncope, bleeding, or neurologic sequela. The patient is tolerating medications without difficulties and is otherwise without complaint today.    Past Medical History  Diagnosis Date  . PAF (paroxysmal atrial fibrillation) 09/2014    a.  Echo 6/16:  Vigorous LVF, EF 65-70%, no RWMA, Gr 1 DD  . Colon polyps    Past Surgical History  Procedure Laterality Date  . Cesarean section  1987     Current Outpatient Prescriptions  Medication Sig Dispense Refill  . apixaban (ELIQUIS) 5 MG TABS tablet Take 1 tablet (5 mg total) by mouth 2 (two) times daily. 60 tablet 11  . CALCIUM-MAGNESIUM-VITAMIN D PO Take 2 capsules by mouth daily.     . Magnesium Oxide 250 MG TABS Take 1 tablet by mouth daily.    . metoprolol tartrate (LOPRESSOR) 25 MG tablet Take 0.5 tablets (12.5 mg total) by mouth daily as needed (FOR PALPITATIONS). 60 tablet 11  . Multiple Vitamin (MULTIVITAMIN) capsule Take 1 capsule by mouth daily. Ultra Women 50+    . NON FORMULARY Valerian Root with Passion Fruit 450 mg Take 1 tablet by mouth at bedtime    . omega-3 fish oil (MAXEPA) 1000 MG CAPS capsule Take 1 capsule by mouth daily.    . vitamin B-12 (CYANOCOBALAMIN) 1000 MCG tablet Take 1,000 mcg by mouth daily.     No current  facility-administered medications for this visit.    Allergies:   Review of patient's allergies indicates no known allergies.   Social History:  The patient  reports that she has quit smoking. She has never used smokeless tobacco. She reports that she drinks about 4.2 oz of alcohol per week. She reports that she does not use illicit drugs.   Family History:  The patient's  family history includes Alzheimer's disease in her father; Cancer in her mother.    ROS:  Please see the history of present illness.   All other systems are reviewed and negative.    PHYSICAL EXAM: VS:  BP 108/70 mmHg  Pulse 73  Ht 5\' 5"  (1.651 m)  Wt 64.864 kg (143 lb)  BMI 23.80 kg/m2 , BMI Body mass index is 23.8 kg/(m^2). GEN: Well nourished, well developed, in no acute distress HEENT: normal Neck: no JVD, carotid bruits, or masses Cardiac: RRR; no murmurs, rubs, or gallops,no edema  Respiratory:  clear to auscultation bilaterally, normal work of breathing GI: soft, nontender, nondistended, + BS MS: no deformity or atrophy Skin: warm and dry  Neuro:  Strength and sensation are intact Psych: euthymic mood, full affect  ekg today reveals sinus rhythm  Recent Labs: 10/01/2014: ALT 20; BUN 24*; Creatinine, Ser 0.84; Hemoglobin 14.1; Platelets 221; Potassium 3.7; Sodium 142; TSH 3.114    Lipid Panel  No results  found for: CHOL, TRIG, HDL, CHOLHDL, VLDL, LDLCALC, LDLDIRECT   Wt Readings from Last 3 Encounters:  01/05/15 64.864 kg (143 lb)  11/25/14 64.32 kg (141 lb 12.8 oz)  11/17/14 63.776 kg (140 lb 9.6 oz)     ASSESSMENT AND PLAN:  1.  Atrial fibrillation chads2vasc score is 2.   She is doing well with eliquis I would reserve AAD therapy for worsening symptoms. Regular activity is encouraged  She had 2 pages of questions which we discussed at length today. Today, I have spent 25 minutes with the patient discussing atrial fibrillation .  More than 50% of the visit time today was spent on this  issue.  Follow-up:  Return to see me in 6 months for further discussion  Current medicines are reviewed at length with the patient today.   The patient does not have concerns regarding her medicines.  The following changes were made today:  none   Signed, Hillis Range, MD  01/07/2015 2:04 PM     [facility] [address] [place] [address] [phone] (office) [phone] (fax)

## 2015-07-04 ENCOUNTER — Ambulatory Visit (INDEPENDENT_AMBULATORY_CARE_PROVIDER_SITE_OTHER): Payer: Medicare Other | Admitting: Internal Medicine

## 2015-07-04 ENCOUNTER — Encounter: Payer: Self-pay | Admitting: Internal Medicine

## 2015-07-04 VITALS — BP 112/74 | HR 77 | Ht 65.0 in | Wt 142.0 lb

## 2015-07-04 DIAGNOSIS — I480 Paroxysmal atrial fibrillation: Secondary | ICD-10-CM | POA: Diagnosis not present

## 2015-07-04 NOTE — Patient Instructions (Signed)

## 2015-07-04 NOTE — Progress Notes (Signed)
Electrophysiology Office Note   Date:  07/04/2015   ID:  Amber Mccarthy, DOB [birth year], MRN [id number]  PCP:  Hollice Espy, MD  Cardiologist:  Excell Seltzer Primary Electrophysiologist: Hillis Range, MD    Chief Complaint  Patient presents with  . Atrial Fibrillation    pt states a sometimes SOB     History of Present Illness: Amber Mccarthy is a 75 y.o. female who presents today for electrophysiology evaluation.   The patient is doing well.  She has had no further afib.  . No significant FHx of CAD. She denies a hx of Diabetes, HTN, prior CVA or CHF. Today, she denies symptoms of  chest pain, shortness of breath, orthopnea, PND, lower extremity edema, claudication, dizziness, presyncope, syncope, bleeding, or neurologic sequela. The patient is tolerating medications without difficulties and is otherwise without complaint today.    Past Medical History  Diagnosis Date  . PAF (paroxysmal atrial fibrillation) (HCC) 09/2014    a.  Echo 6/16:  Vigorous LVF, EF 65-70%, no RWMA, Gr 1 DD  . Colon polyps    Past Surgical History  Procedure Laterality Date  . Cesarean section  1987     Current Outpatient Prescriptions  Medication Sig Dispense Refill  . apixaban (ELIQUIS) 5 MG TABS tablet Take 1 tablet (5 mg total) by mouth 2 (two) times daily. 60 tablet 11  . CALCIUM-MAGNESIUM-VITAMIN D PO Take 2 capsules by mouth daily.     . Coenzyme Q10 (COQ-10) 100 MG CAPS Take 100 mg by mouth daily.    . metoprolol tartrate (LOPRESSOR) 25 MG tablet Take 0.5 tablets (12.5 mg total) by mouth daily as needed (FOR PALPITATIONS). 60 tablet 11  . Multiple Vitamin (MULTIVITAMIN) capsule Take 1 capsule by mouth daily. Ultra Women 50+    . omega-3 fish oil (MAXEPA) 1000 MG CAPS capsule Take 1 capsule by mouth daily.    . vitamin B-12 (CYANOCOBALAMIN) 1000 MCG tablet Take 1,000 mcg by mouth daily.     No current facility-administered medications for this visit.    Allergies:   Review of  patient's allergies indicates no known allergies.   Social History:  The patient  reports that she has quit smoking. She has never used smokeless tobacco. She reports that she drinks about 4.2 oz of alcohol per week. She reports that she does not use illicit drugs.   Family History:  The patient's  family history includes Alzheimer's disease in her father; Cancer in her mother.    ROS:  Please see the history of present illness.   All other systems are reviewed and negative.    PHYSICAL EXAM: VS:  BP 112/74 mmHg  Pulse 77  Ht  (1.651 m)  Wt 142 lb (64.411 kg)  BMI 23.63 kg/m2 , BMI Body mass index is 23.63 kg/(m^2). GEN: Well nourished, well developed, in no acute distress HEENT: normal Neck: no JVD, carotid bruits, or masses Cardiac: RRR; no murmurs, rubs, or gallops,no edema  Respiratory:  clear to auscultation bilaterally, normal work of breathing GI: soft, nontender, nondistended, + BS MS: no deformity or atrophy Skin: warm and dry  Neuro:  Strength and sensation are intact Psych: euthymic mood, full affect  ekg today reveals sinus rhythm  Recent Labs: 10/01/2014: ALT 20; BUN 24*; Creatinine, Ser 0.84; Hemoglobin 14.1; Platelets 221; Potassium 3.7; Sodium 142; TSH 3.114    Lipid Panel  No results found for: CHOL, TRIG, HDL, CHOLHDL, VLDL, LDLCALC, LDLDIRECT   Wt Readings from Last 3  Encounters:  07/04/15 142 lb (64.411 kg)  01/05/15 143 lb (64.864 kg)  11/25/14 141 lb 12.8 oz (64.32 kg)     ASSESSMENT AND PLAN:  1.  Atrial fibrillation chads2vasc score is 2.   She is doing well with eliquis I would reserve AAD therapy for worsening symptoms. Regular activity is encouraged She will return in 1 year If no further afib at that point, could reconsider anticoagulation.  ILR for rhythm management could be considered at that time.   Follow-up:  Return to see me in 12 months for further discussion  Current medicines are reviewed at length with the patient today.    The patient does not have concerns regarding her medicines.  The following changes were made today:  none   Signed, Hillis RangeJames Monicia Tse, MD  07/04/2015 9:41 AM     [facility] [address] [place] [address] [phone] (office) [phone] (fax)

## 2015-10-26 ENCOUNTER — Other Ambulatory Visit: Payer: Self-pay | Admitting: *Deleted

## 2015-10-26 DIAGNOSIS — I480 Paroxysmal atrial fibrillation: Secondary | ICD-10-CM

## 2015-10-26 MED ORDER — APIXABAN 5 MG PO TABS: 5.0000 mg | ORAL_TABLET | Freq: Two times a day (BID) | ORAL | Status: DC

## 2016-06-27 ENCOUNTER — Encounter: Payer: Self-pay | Admitting: Internal Medicine

## 2016-07-09 ENCOUNTER — Encounter: Payer: Self-pay | Admitting: Internal Medicine

## 2016-07-09 ENCOUNTER — Ambulatory Visit (INDEPENDENT_AMBULATORY_CARE_PROVIDER_SITE_OTHER): Payer: Medicare Other | Admitting: Internal Medicine

## 2016-07-09 VITALS — BP 128/78 | HR 72 | Ht 65.0 in | Wt 146.6 lb

## 2016-07-09 DIAGNOSIS — I480 Paroxysmal atrial fibrillation: Secondary | ICD-10-CM | POA: Diagnosis not present

## 2016-07-09 NOTE — Progress Notes (Signed)
Electrophysiology Office Note   Date:  07/09/2016   ID:  Amber Mccarthy, DOB February 11, 1941, MRN [id number]  PCP:  Hollice Espy, MD  Cardiologist:  Excell Seltzer Primary Electrophysiologist: Hillis Range, MD    Chief Complaint  Patient presents with  . Atrial Fibrillation     History of Present Illness: Amber Mccarthy is a 76 y.o. female who presents today for electrophysiology evaluation.   The patient is doing well.  She has had no further afib in several years.  Today, she denies symptoms of  chest pain, shortness of breath, orthopnea, PND, lower extremity edema, claudication, dizziness, presyncope, syncope, bleeding, or neurologic sequela. The patient is tolerating medications without difficulties and is otherwise without complaint today.    Past Medical History:  Diagnosis Date  . Colon polyps   . PAF (paroxysmal atrial fibrillation) (HCC) 09/2014   a.  Echo 6/16:  Vigorous LVF, EF 65-70%, no RWMA, Gr 1 DD   Past Surgical History:  Procedure Laterality Date  . CESAREAN SECTION  1987     Current Outpatient Prescriptions  Medication Sig Dispense Refill  . apixaban (ELIQUIS) 5 MG TABS tablet Take 1 tablet (5 mg total) by mouth 2 (two) times daily. 60 tablet 10  . CALCIUM-MAGNESIUM-ZINC PO Take 3 tablets by mouth daily.    . Multiple Vitamin (MULTIVITAMIN) capsule Take 1 capsule by mouth daily. Ultra Women 50+    . omega-3 fish oil (MAXEPA) 1000 MG CAPS capsule Take 1 capsule by mouth daily.    . vitamin B-12 (CYANOCOBALAMIN) 1000 MCG tablet Take 1,000 mcg by mouth daily.    . Coenzyme Q10 (COQ-10) 100 MG CAPS Take 100 mg by mouth daily.    . metoprolol tartrate (LOPRESSOR) 25 MG tablet Take 0.5 tablets (12.5 mg total) by mouth daily as needed (FOR PALPITATIONS). (Patient not taking: Reported on 07/09/2016) 60 tablet 11   No current facility-administered medications for this visit.     Allergies:   Patient has no known allergies.   Social History:  The patient   reports that she has quit smoking. She has never used smokeless tobacco. She reports that she drinks about 4.2 oz of alcohol per week . She reports that she does not use drugs.   Family History:  The patient's  family history includes Alzheimer's disease in her father; Cancer in her mother.    ROS:  Please see the history of present illness.   All other systems are reviewed and negative.    PHYSICAL EXAM: VS:  BP 128/78   Pulse 72   Ht 5\' 5"  (1.651 m)   Wt 146 lb 9.6 oz (66.5 kg)   SpO2 98%   BMI 24.40 kg/m  , BMI Body mass index is 24.4 kg/m. GEN: Well nourished, well developed, in no acute distress HEENT: normal , OP clear Neck: no JVD  Cardiac: RRR; no murmurs, rubs, or gallops,no edema  Respiratory:  clear to auscultation bilaterally, normal work of breathing GI: soft, nontender, nondistended, + BS MS: no deformity or atrophy  Skin: warm and dry  Neuro:  Strength and sensation are intact Psych: euthymic mood, full affect, verbose  ekg today reveals sinus rhythm   Wt Readings from Last 3 Encounters:  07/09/16 146 lb 9.6 oz (66.5 kg)  07/04/15 142 lb (64.4 kg)  01/05/15 143 lb (64.9 kg)     ASSESSMENT AND PLAN:  1.  Atrial fibrillation chads2vasc score is 2.   (annual stroke risk 2.2%) She is doing well  with eliquis Today, we discussed pros and cons of long term anticoagulation.  As she has had no afib in several years, she would like to consider stopping anticoagulation.  We discussed options of 1. Continuing long term anticoagulation, 2. Stopping eliquis, and 3, ILR placement for further surveillance and stopping anticoagulation if she has no afib on ILR follow-up.  Today, I have spent 25  minutes with the patient discussing these options .  More than 50% of the visit time today was spent on this issue.  She asked a lot of questions, which I attempted to answer today.  She will think about her options.  IF she decides to consider ILR, she wil contact my office.   Otherwise, she will return in 1 year for follow-up   Current medicines are reviewed at length with the patient today.   The patient does not have concerns regarding her medicines.  The following changes were made today:  none   Signed, Hillis RangeJames Niamya Vittitow, MD  07/09/2016    [facility] [address] [place] [id number] [phone] (office) [phone] (fax)

## 2016-07-09 NOTE — Patient Instructions (Signed)
Medication Instructions: - Your physician recommends that you continue on your current medications as directed. Please refer to the Current Medication list given to you today.  Labwork: - none ordered  Procedures/Testing: - Call Dr. Jenel LucksAllred's nurse, Tresa EndoKelly, if you decide you would like to proceed with a loop recorder (LINQ) implant  Follow-Up: - Your physician wants you to follow-up in: 1 year with Ulyses Jarredenee Usury, PA for Dr. Johney FrameAllred.  You will receive a reminder letter in the mail two months in advance. If you don't receive a letter, please call our office to schedule the follow-up appointment.  Any Additional Special Instructions Will Be Listed Below (If Applicable).     If you need a refill on your cardiac medications before your next appointment, please call your pharmacy.

## 2016-08-30 ENCOUNTER — Other Ambulatory Visit: Payer: Self-pay | Admitting: Internal Medicine

## 2016-08-30 DIAGNOSIS — I480 Paroxysmal atrial fibrillation: Secondary | ICD-10-CM

## 2016-08-30 NOTE — Telephone Encounter (Signed)
Age 76 Wt 66.5kg 07/09/2016 Saw Dr Johney FrameAllred on 07/09/2016 06/14/2016 SrCr 0.73 Hgb 15.2 Hct 44.9  Refill done for Eliquis 5mg  q 12 hours as requested

## 2016-09-28 ENCOUNTER — Ambulatory Visit (INDEPENDENT_AMBULATORY_CARE_PROVIDER_SITE_OTHER): Payer: Medicare Other | Admitting: Cardiovascular Disease

## 2016-09-28 ENCOUNTER — Encounter: Payer: Self-pay | Admitting: Cardiovascular Disease

## 2016-09-28 VITALS — BP 120/72 | HR 77 | Ht 65.0 in | Wt 144.0 lb

## 2016-09-28 DIAGNOSIS — I471 Supraventricular tachycardia: Secondary | ICD-10-CM | POA: Diagnosis not present

## 2016-09-28 DIAGNOSIS — I480 Paroxysmal atrial fibrillation: Secondary | ICD-10-CM | POA: Diagnosis not present

## 2016-09-28 NOTE — Patient Instructions (Signed)
Dr Croitoru recommends that you schedule a follow-up appointment in 12 months. You will receive a reminder letter in the mail two months in advance. If you don't receive a letter, please call our office to schedule the follow-up appointment.  If you need a refill on your cardiac medications before your next appointment, please call your pharmacy. 

## 2016-09-28 NOTE — Progress Notes (Signed)
Cardiology Office Note:    Date:  09/28/2016   ID:  Amber Mccarthy, DOB 07-26-1940, MRN [id number]  PCP:  Shaune Pollack, MD  Cardiologist:  Thurmon Fair, MD    Referring MD: Shaune Pollack, MD   Chief Complaint  Patient presents with  . New Evaluation  Here to discuss atrial fibrillation/implantable loop recorder, second opinion  History of Present Illness:    Amber Mccarthy is a 76 y.o. female with a hx of Paroxysmal atrial fibrillation, without clinically evident recurrence in years.  In June 2016, after a long and very stressful day she had sudden onset of severe tachycardia while lying in bed. She counted her heart rate at 160 bpm and called EMS. She remembers the palpitations as fast, but not irregular. The diagnosis of atrial fibrillation was reportedly made based on rhythm tracings during ambulance transportation. There is an EMS 12-lead ECG that is scanned in the computer that actually shows sinus rhythm. I'm not sure if EMS did more than one tracing. The actual 12-lead ECG in the emergency room showed normal sinus rhythm. A Holter monitor performed after that emergency room visit did not show atrial fibrillation although there were 4-5 beat runs of paroxysmal atrial tachycardia. She has been taking Eliquis since July 2016. She has not had any bleeding complications with this and denies any focal neurological events  She's been provided metoprolol as needed for palpitations, but has never actually needed to take it. She has occasional palpitations that last from a few seconds to several minutes. Longest episode of palpitations lasts under 30 minutes. She has learned to control them through "yoga breathing". The onset of palpitations seems to be fairly abrupt, but the resolution sounds like it occurs gradually.  Her only risk factors for embolic events are age and gender. She does not have vascular disease, hypertension diabetes, history of heart failure or stroke/TIA. Echo in  2016 showed normal left ventricular ejection fraction with EF 65-70% in the left atrium was described as normal in size.   She is a Doctor, general practice, retired head of the speech center at [employer]  Past Medical History:  Diagnosis Date  . Colon polyps   . PAF (paroxysmal atrial fibrillation) (HCC) 09/2014   a.  Echo 6/16:  Vigorous LVF, EF 65-70%, no RWMA, Gr 1 DD    Past Surgical History:  Procedure Laterality Date  . CESAREAN SECTION  1987    Current Medications: Current Meds  Medication Sig  . Calcium Carb-Cholecalciferol (CALCIUM-VITAMIN D) 500-200 MG-UNIT tablet Take 2 tablets by mouth daily.  Marland Kitchen ELIQUIS 5 MG TABS tablet TAKE 1 TABLET (5 MG TOTAL) BY MOUTH 2 (TWO) TIMES DAILY.  . metoprolol tartrate (LOPRESSOR) 25 MG tablet Take 0.5 tablets (12.5 mg total) by mouth daily as needed (FOR PALPITATIONS).  . Multiple Vitamin (MULTIVITAMIN) capsule Take 1 capsule by mouth daily. Ultra Women 50+  . omega-3 fish oil (MAXEPA) 1000 MG CAPS capsule Take 1,200 capsules by mouth daily.   . vitamin B-12 (CYANOCOBALAMIN) 1000 MCG tablet Take 1,000 mcg by mouth daily.     Allergies:   Patient has no known allergies.   Social History   Social History  . Marital status: Married    Spouse name: [name]  . Number of children: 1  . Years of education: N/A   Occupational History  . speech pathologist     Retired from [employer]   Social History Main Topics  . Smoking status: Former Games developer  . Smokeless  tobacco: Never Used     Comment: quit 1970s  . Alcohol use 4.2 oz/week    7 Standard drinks or equivalent per week     Comment: 1 glass of wine per night--> since afib she has decreased  . Drug use: No  . Sexual activity: Not on file   Other Topics Concern  . Not on file   Social History Narrative   [place].  Retired.  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     Family History: The patient's family history includes  Alzheimer's disease in her father; Cancer in her mother. ROS:   Please see the history of present illness.     All other systems reviewed and are negative.  EKGs/Labs/Other Studies Reviewed:    The following studies were reviewed today: Records from emergency room visit June 2016, atrial fibrillation clinic, EP consultations with Dr. Johney FrameAllred  EKG:  EKG is  ordered today.  The ekg ordered today demonstrates normal sinus rhythm, normal tracing  Recent Labs: No results found for requested labs within last 8760 hours.   Recent Lipid Panel No results found for: CHOL, TRIG, HDL, CHOLHDL, VLDL, LDLCALC, LDLDIRECT  Physical Exam:    VS:  BP 120/72 (BP Location: Right Arm, Patient Position: Sitting, Cuff Size: Normal)   Pulse 77   Ht 5\' 5"  (1.651 m)   Wt 144 lb (65.3 kg)   BMI 23.96 kg/m     Wt Readings from Last 3 Encounters:  09/28/16 144 lb (65.3 kg)  07/09/16 146 lb 9.6 oz (66.5 kg)  07/04/15 142 lb (64.4 kg)     GEN: Thin, healthy-appearing, athletic Well nourished, well developed in no acute distress HEENT: Normal NECK: No JVD; No carotid bruits LYMPHATICS: No lymphadenopathy CARDIAC: RRR, no murmurs, rubs, gallops RESPIRATORY:  Clear to auscultation without rales, wheezing or rhonchi  ABDOMEN: Soft, non-tender, non-distended MUSCULOSKELETAL:  No edema; No deformity  SKIN: Warm and dry NEUROLOGIC:  Alert and oriented x 3 PSYCHIATRIC:  Normal affect   ASSESSMENT:    1. PAF (paroxysmal atrial fibrillation) (HCC)   2. PAT (paroxysmal atrial tachycardia) (HCC)    PLAN:    In order of problems listed above:  1. PAT: This rhythm has been documented on Holter monitor tracings. It may explain her palpitations. It does not have the same embolic risk consultation that atrial fibrillation with. No evidence of structural heart disease. Other than beta blockers when necessary, no specific therapy appears to be necessary. 2. PAFib: In my opinion this diagnosis has not been  proven. It's possible that there was more than 1 ECG tracing performed by EMS, that was not recorded in the electronic documents. At this point I don't think will have any chance of retrieving such a tracing. I think the most prudent approach is to continue anticoagulation for the time being, while we try to prove or disprove the diagnosis of atrial fibrillation. I told the patient that an implantable loop recorder is definitely the most reliable way (probably the only reliable way) to exclude episodes of atrial fibrillation that they be happening infrequently and in asymptomatic fashion. She is clearly averse to the idea of any type of artificial device implanted in her body. I think the next best step would be to perform checks on her rhythm whenever she has palpitations with a electronic device. Explained why mechanical recording such as those from her iWatch, a pulse oximeter or blood pressure  cuff would be inadequate to diagnose an arrhythmia such as atrial fibrillation. Suggested a Science writer device. Reiterated the fact that "spot checks" of her rhythm cannot 100% exclude silent atrial fibrillation. Not sure there is a scientifically dependable duration of observation before we decide what to do, but at least several months sounds reasonable. Even if she has atrial fibrillation she is at relatively low risk for anticoagulation, at most 2.2% per year statistically. CHADS 1, CHADSVasc 2. Her risk factors are age and gender, but chronologically she is older than biologically. Stopping the anticoagulation would be associated with a relatively low risk. I will be delighted to see her again, but I made it clear to her that I am not an electrophysiologist. If she requires more specialized antiarrhythmic therapy or procedure such as EP study and ablation will refer her back to Dr. [name] or one of his partners.   Medication Adjustments/Labs and Tests Ordered: Current medicines are reviewed at length with  the patient today.  Concerns regarding medicines are outlined above. Labs and tests ordered and medication changes are outlined in the patient instructions below:  Patient Instructions  Dr Royann Shivers recommends that you schedule a follow-up appointment in 12 months. You will receive a reminder letter in the mail two months in advance. If you don't receive a letter, please call our office to schedule the follow-up appointment.  If you need a refill on your cardiac medications before your next appointment, please call your pharmacy.    Signed, Thurmon Fair, MD  09/28/2016 4:02 PM    [facility]

## 2016-11-05 ENCOUNTER — Telehealth: Payer: Self-pay | Admitting: *Deleted

## 2016-11-05 NOTE — Telephone Encounter (Signed)
Informed patient. She will email you with these, I'm also getting medical records to scan in copy.

## 2016-11-05 NOTE — Telephone Encounter (Signed)
A lot of patients have trouble with my chart attachments. I usually ask patients to email strips to my institutional account [email or web address], but I also ask them to always notify me on [email or web address] when they do send anything (because I will see that sooner than I check my email). Please scan the strips she brought in under media and forward to my in-basket. Thanks [employer]

## 2016-11-05 NOTE — Telephone Encounter (Signed)
Pt of Dr. Royann Shiversroitoru  Walk-in request for non-urgent concern  Pt had trouble emailing her Amber SimmondsKardia event strips per Dr. Erin Hearingroitoru's instructions. I had advised her to use mychart and attach these into a mychart message and send.  She printed off some strips for review. I have placed these in Dr. Erin Hearingroitoru's box. She asks if this needs any f/u.  Pt may be reached at [phone].

## 2017-03-25 ENCOUNTER — Other Ambulatory Visit: Payer: Self-pay | Admitting: Internal Medicine

## 2017-03-25 DIAGNOSIS — I480 Paroxysmal atrial fibrillation: Secondary | ICD-10-CM

## 2017-05-16 ENCOUNTER — Telehealth: Payer: Self-pay | Admitting: Cardiovascular Disease

## 2017-05-16 NOTE — Telephone Encounter (Signed)
New message  Pt verbalized that she is calling for the RN  She has some questions that she want to ask the rn

## 2017-05-16 NOTE — Telephone Encounter (Signed)
Returned call to patient of Dr. Royann Shiversroitoru. She is calling to schedule an appointment with Dr. Royann Shiversroitoru before June when she is due for her annual appointment.   She reports she had what she thinks is 1 AF episode since her last visit, while in [place] on a river cruise in the middle of the night, her HR was 110bpm. She was unable to send in her heart rhythm/EKG at this particular time.   Her question/concern: She works out, has a Psychologist, educationaltrainer 2 times a week (HR 80-100bpm), she hikes, walks 2 miles on the track. She does not get out of breath when walking at a moderate pace/brisk walk on the track, but with increasing intensity/speed, her HR gets up to 170bpm but she does not get SOB.  Is she "hurting her heart" when she walks briskly on the track and her HR is up to 170bpm?  Scheduled her for 1 year visit on May 20

## 2017-05-16 NOTE — Telephone Encounter (Signed)
As a general rule, it is preferable that she not exceed a heart rate of about 130 bpm when exercising. Having said that, if the faster heart rate is not sustained for long periods of time it will not be harmful, especially if she has no complaints during exercise. Is she sure that the heart rate of 170 bpm is accurate? How is it being recorded? MCr

## 2017-05-17 NOTE — Telephone Encounter (Signed)
LM that MD recommendations/advice will be communicated with MyChart

## 2017-05-17 NOTE — Telephone Encounter (Signed)
Per patient's MyChart response:  ----- Message from Mychart, Generic sent at 05/17/2017 10:52 AM EST -----    Thank you for this helpful information. I use a Versa Fitbit but i am thinking of getting the new Apple watch that has an EKG function.  Amber CoverVicki Urista

## 2017-07-24 ENCOUNTER — Other Ambulatory Visit: Payer: Self-pay | Admitting: Cardiovascular Disease

## 2017-07-24 DIAGNOSIS — I480 Paroxysmal atrial fibrillation: Secondary | ICD-10-CM

## 2017-09-09 ENCOUNTER — Encounter: Payer: Self-pay | Admitting: Cardiovascular Disease

## 2017-09-09 ENCOUNTER — Ambulatory Visit: Payer: Medicare Other | Admitting: Cardiovascular Disease

## 2017-09-09 VITALS — BP 142/78 | HR 82 | Ht 65.0 in | Wt 144.8 lb

## 2017-09-09 DIAGNOSIS — I480 Paroxysmal atrial fibrillation: Secondary | ICD-10-CM

## 2017-09-09 NOTE — Progress Notes (Signed)
Cardiology Office Note:    Date:  09/09/2017   ID:  [name], DOB 1941/04/14, MRN [id number]  PCP:  Shaune Pollack, MD  Cardiologist:  Thurmon Fair, MD    Referring MD: Shaune Pollack, MD   Chief complaint: discuss atrial fibrillation/implantable loop recorder, second opinion  History of Present Illness:    Amber Mccarthy is a 77 y.o. female with a hx of possible paroxysmal atrial fibrillation, without clinically evident recurrence in years.  The initial diagnosis was based on the EMS run sheet from October 01, 2014.  There is a lot of motion artifact on the baseline, but there does seem to be a regular underlying rapid rhythm with occasional ectopic beats.  Subsequent ECGs in the emergency room showed normal sinus rhythm.  A Holter monitor performed long after that emergency room visit showed 4-5 beat runs of paroxysmal atrial tachycardia, but no atrial fibrillation.  She used the [employer] device for about a year, consistently showing normal rhythm.  She has not been using an apple watch virtually 24 hours a day 7 days a week and in the last 3 months has not recorded any true arrhythmia.  She was prescribed beta-blockers to use as needed for palpitations but has never taken them.  Her typical heart rate is in the 60s and 70s.  She has noticed occasional faster heartbeats to 110 bpm, regular rhythm,  with gradual onset and gradual resolution, consistent with sinus tachycardia.  They are often associated with a long tiring day or dehydration.  She exercises regularly.  She walks 2-4 miles daily 5 days a week.  Her only risk factors for embolic events are age and gender. She does not have vascular disease, hypertension diabetes, history of heart failure or stroke/TIA. Echo in 2016 showed normal left ventricular ejection fraction with EF 65-70% in the left atrium was described as normal in size.   She is a Doctor, general practice, retired head of the speech center at [employer]  Past Medical  History:  Diagnosis Date  . Colon polyps   . PAF (paroxysmal atrial fibrillation) (HCC) 09/2014   a.  Echo 6/16:  Vigorous LVF, EF 65-70%, no RWMA, Gr 1 DD    Past Surgical History:  Procedure Laterality Date  . CESAREAN SECTION  1987    Current Medications: Current Meds  Medication Sig  . Calcium Carb-Cholecalciferol (CALCIUM-VITAMIN D) 500-200 MG-UNIT tablet Take 2 tablets by mouth daily.  . Multiple Vitamin (MULTIVITAMIN) capsule Take 1 capsule by mouth daily. Ultra Women 50+  . vitamin B-12 (CYANOCOBALAMIN) 1000 MCG tablet Take 1,000 mcg by mouth daily.  . [DISCONTINUED] ELIQUIS 5 MG TABS tablet TAKE 1 TABLET BY MOUTH TWICE A DAY  . [DISCONTINUED] metoprolol tartrate (LOPRESSOR) 25 MG tablet Take 0.5 tablets (12.5 mg total) by mouth daily as needed (FOR PALPITATIONS).     Allergies:   Patient has no known allergies.   Social History   Socioeconomic History  . Marital status: Married    Spouse name: [name]  . Number of children: 1  . Years of education: Not on file  . Highest education level: Not on file  Occupational History  . Occupation: Doctor, general practice    Comment: Retired from [employer]  . Financial resource strain: Not on file  . Food insecurity:    Worry: Not on file    Inability: Not on file  . Transportation needs:    Medical: Not on file    Non-medical: Not on file  Tobacco Use  . Smoking status: Former Games developer  . Smokeless tobacco: Never Used  . Tobacco comment: quit 1970s  Substance and Sexual Activity  . Alcohol use: Yes    Alcohol/week: 4.2 oz    Types: 7 Standard drinks or equivalent per week    Comment: 1 glass of wine per night--> since afib she has decreased  . Drug use: No  . Sexual activity: Not on file  Lifestyle  . Physical activity:    Days per week: Not on file    Minutes per session: Not on file  . Stress: Not on file  Relationships  . Social connections:    Talks on phone: Not on file    Gets together: Not  on file    Attends religious service: Not on file    Active member of club or organization: Not on file    Attends meetings of clubs or organizations: Not on file    Relationship status: Not on file  Other Topics Concern  . Not on file  Social History Narrative   Pt lives in [place] with spouse.  Retired.  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     Family History: The patient's family history includes Alzheimer's disease in her father; Cancer in her mother. ROS:   Please see the history of present illness.     All other systems reviewed and are negative.  EKGs/Labs/Other Studies Reviewed:    The following studies were reviewed today: Records from EMS, emergency room visit June 2016, atrial fibrillation clinic, EP consultations with Dr. [name]  EKG:  EKG is  ordered today.  The ekg ordered today demonstrates NSR, normal tracing  Recent Labs: No results found for requested labs within last 8760 hours.   Recent Lipid Panel No results found for: CHOL, TRIG, HDL, CHOLHDL, VLDL, LDLCALC, LDLDIRECT  Physical Exam:    VS:  BP (!) 142/78   Pulse 82   Ht  (1.651 m)   Wt 144 lb 12.8 oz (65.7 kg)   BMI 24.10 kg/m     Wt Readings from Last 3 Encounters:  09/09/17 144 lb 12.8 oz (65.7 kg)  09/28/16 144 lb (65.3 kg)  07/09/16 146 lb 9.6 oz (66.5 kg)     GEN: Thin, healthy-appearing, athletic Well nourished, well developed in no acute distress HEENT: Normal NECK: No JVD; No carotid bruits LYMPHATICS: No lymphadenopathy CARDIAC: RRR, no murmurs, rubs, gallops RESPIRATORY:  Clear to auscultation without rales, wheezing or rhonchi  ABDOMEN: Soft, non-tender, non-distended MUSCULOSKELETAL:  No edema; No deformity  SKIN: Warm and dry NEUROLOGIC:  Alert and oriented x 3 PSYCHIATRIC:  Normal affect   ASSESSMENT:    No diagnosis found. PLAN:    In order of problems listed above:  1. PAF: The diagnosis is based entirely on a single  event that occurred in 2016, without subsequent recurrence.  There are technical problems with the recording, with severe baseline motion artifact.  The twelve-lead suggests that it might have been sinus tachycardia paroxysmal atrial tachycardia with PACs.  The rhythm strip is more irregular. She has been carefully monitoring her heart rhythm ever since and has not seen a similar episode of tachycardia.  Did spot checks during palpitations with a rhythm monitor and a continuous 3 months recording with an Apple watch. Even if she has atrial fibrillation she is at relatively low risk for embolic events, at most 3.2% per year statistically (she just turned 76).  She does not have any structural heart disease and her left atrium is small.  CHADS 2, CHADSVasc 3. Her risk factors are age and gender, but chronologically she is older than she is biologically. Stopping the anticoagulation would be associated with a relatively low risk.  She is firmly adherent to monitoring with her apple watch and has clearly bought into the technology.  She is clearly appreciative of all the statistical data provided by the device and will keep wearing it.  Has heard about the clinical trial using the apple watch as a guide to anticoagulation.  Understands that we do not yet have any clinical trial data about interrupting anticoagulation with continuous rhythm monitoring, but it is definitely an appealing concept. At this point I think she can discontinue the Eliquis with low risk of embolic events.  She has never taken the metoprolol.  Plans to continue monitoring her rhythm using the apple device.  Call us if there is any recurrent abnormality.  Medication Adjustments/Labs and Tests Ordered: Current medicines are reviewed at length with the patient today.  Concerns regarding medicines are outlined above. Labs and tests ordered and medication changes are outlined in the patient instructions below:  Patient Instructions  Medication  Instructions:  STOP METOPROLOL   STOP ELIQUIS   Labwork: NONE  Testing/Procedures: NONE  Follow-Up: AS NEEDED      Signed, Thurmon Fair, MD  09/09/2017 2:05 PM    [facility]

## 2017-09-09 NOTE — Patient Instructions (Signed)
Medication Instructions:  STOP METOPROLOL   STOP ELIQUIS   Labwork: NONE  Testing/Procedures: NONE  Follow-Up: AS NEEDED

## 2017-09-11 NOTE — Addendum Note (Signed)
Addended by: Chana Bode on: 09/11/2017 11:44 AM   Modules accepted: Orders

## 2018-06-07 ENCOUNTER — Emergency Department (HOSPITAL_COMMUNITY): Payer: Medicare Other

## 2018-06-07 ENCOUNTER — Encounter (HOSPITAL_COMMUNITY): Payer: Self-pay

## 2018-06-07 ENCOUNTER — Emergency Department (HOSPITAL_COMMUNITY)
Admission: EM | Admit: 2018-06-07 | Discharge: 2018-06-08 | Disposition: A | Discharge status: Home / Self Care | Payer: Medicare Other | Attending: Emergency Medicine | Admitting: Emergency Medicine

## 2018-06-07 DIAGNOSIS — Z87891 Personal history of nicotine dependence: Secondary | ICD-10-CM | POA: Diagnosis not present

## 2018-06-07 DIAGNOSIS — Z79899 Other long term (current) drug therapy: Secondary | ICD-10-CM | POA: Insufficient documentation

## 2018-06-07 DIAGNOSIS — M25561 Pain in right knee: Secondary | ICD-10-CM | POA: Diagnosis not present

## 2018-06-07 MED ORDER — OXYCODONE-ACETAMINOPHEN 5-325 MG PO TABS
1.0000 | ORAL_TABLET | Freq: Once | ORAL | Status: AC
  Administered 2018-06-08: 1 via ORAL
  Filled 2018-06-07: qty 1

## 2018-06-07 MED ORDER — IBUPROFEN 400 MG PO TABS
600.0000 mg | ORAL_TABLET | Freq: Once | ORAL | Status: AC
  Administered 2018-06-08: 600 mg via ORAL
  Filled 2018-06-07: qty 1

## 2018-06-07 NOTE — ED Provider Notes (Signed)
[facility] EMERGENCY DEPARTMENT Provider Note   CSN: [id number] Arrival date & time: 06/07/18  2221     History   Chief Complaint Chief Complaint  Patient presents with  . Leg Pain    HPI Amber Mccarthy is a 78 y.o. female.  HPI Patient is an active 78 year old female presents the emergency department with acute onset right knee pain which began while laying down for bed.  She states normally she lays in her bed with her legs underneath her prior to going to bed at which point she attempts to put them under the sheets.  She states when she began bringing her legs forward she developed severe sudden pain in her right knee and was unable to straighten her right knee.     Past Medical History:  Diagnosis Date  . Colon polyps   . PAF (paroxysmal atrial fibrillation) (HCC) 09/2014   a.  Echo 6/16:  Vigorous LVF, EF 65-70%, no RWMA, Gr 1 DD    Patient Active Problem List   Diagnosis Date Noted  . PAF (paroxysmal atrial fibrillation) (HCC) 10/04/2014    Past Surgical History:  Procedure Laterality Date  . CESAREAN SECTION  1987     OB History   No obstetric history on file.      Home Medications    Prior to Admission medications   Medication Sig Start Date End Date Taking? Authorizing Provider  Calcium Carb-Cholecalciferol (CALCIUM-VITAMIN D) 500-200 MG-UNIT tablet Take 2 tablets by mouth daily.    [provider]  Multiple Vitamin (MULTIVITAMIN) capsule Take 1 capsule by mouth daily. Ultra Women 50+    [provider]  vitamin B-12 (CYANOCOBALAMIN) 1000 MCG tablet Take 1,000 mcg by mouth daily.    [provider]    Family History Family History  Problem Relation Age of Onset  . Cancer Mother        angiosarcoma  . Alzheimer's disease Father     Social History Social History   Tobacco Use  . Smoking status: Former Games developer  . Smokeless tobacco: Never Used  . Tobacco comment: quit 1970s  Substance Use Topics   . Alcohol use: Yes    Alcohol/week: 7.0 standard drinks    Types: 7 Standard drinks or equivalent per week    Comment: 1 glass of wine per night--> since afib she has decreased  . Drug use: No     Allergies   Patient has no known allergies.   Review of Systems Review of Systems  All other systems reviewed and are negative.    Physical Exam Updated Vital Signs BP (!) 142/86   Pulse 79   Temp 98.6 F (37 C) (Oral)   Resp 14   Ht 5' 5.5" (1.664 m)   Wt 63.5 kg   SpO2 100%   BMI 22.94 kg/m   Physical Exam Vitals signs and nursing note reviewed.  Constitutional:      Appearance: She is well-developed.  HENT:     Head: Normocephalic.  Neck:     Musculoskeletal: Normal range of motion.  Pulmonary:     Effort: Pulmonary effort is normal.  Abdominal:     General: There is no distension.  Musculoskeletal:     Comments: No obvious deformity of the right lower extremity.  Normal PT and DP pulse in the right foot.  Patella appears located.  Compartments of the right lower extremity are soft.  No erythema or warmth or tenderness of the right lower extremity.  Mild pain with active range of motion.  Less pain with passive range of motion but still with some pain.  Neurological:     Mental Status: She is alert and oriented to person, place, and time.      ED Treatments / Results  Labs (all labs ordered are listed, but only abnormal results are displayed) Labs Reviewed - No data to display  EKG None  Radiology Dg Knee Complete 4 Views Right  Result Date: 06/07/2018 CLINICAL DATA:  Sudden right knee pain. EXAM: RIGHT KNEE - COMPLETE 4+ VIEW COMPARISON:  None. FINDINGS: No evidence of fracture, dislocation, or joint effusion. Mild decreased medial femoral tibial joint space is identified. Soft tissues are unremarkable. IMPRESSION: No acute fracture or dislocation. Electronically Signed   By: Sherian Rein M.D.   On: 06/07/2018 23:16    Procedures Procedures (including  critical care time)  Medications Ordered in ED Medications - No data to display   Initial Impression / Assessment and Plan / ED Course  I have reviewed the triage vital signs and the nursing notes.  Pertinent labs & imaging results that were available during my care of the patient were reviewed by me and considered in my medical decision making (see chart for details).    Pain control here in the emergency department.  Patient still with some pain but appears to be improving.  Plan for discharge home with close outpatient orthopedic follow-up.  Possible internal derangement given mechanism and ongoing knee pain.  No secondary signs of infection.  Doubt septic arthritis.  Normal pulses and compartments and right lower extremity.  Discharged home with pain medication.  She understands return to the ER for new or worsening symptoms.  Note occasion for additional advanced imaging of her right knee at this time.   Final Clinical Impressions(s) / ED Diagnoses   Final diagnoses:  Acute pain of right knee    ED Discharge Orders    None       Azalia Bilis, MD 06/08/18 0030

## 2018-06-07 NOTE — ED Notes (Signed)
Pt in x-ray at this time

## 2018-06-07 NOTE — ED Triage Notes (Signed)
Pt brought in by [facility] from home for right leg pain. Pt states she was doing her normal bedtime routine, states she lays in bed with her legs underneath her, and when she was attempting to straighten them out she noticed a sharp pain and was unable to fully lengthen her leg. Pt states she took x2 ibuprofen which helped her straighten her leg out. Pt leg splinted by EMS, given fentanyl with relief of pain. Pt A+Ox4 and otherwise healthy, states she does yoga and is active.

## 2018-06-08 MED ORDER — MORPHINE SULFATE (PF) 4 MG/ML IV SOLN
4.0000 mg | Freq: Once | INTRAVENOUS | Status: AC
  Administered 2018-06-08: 4 mg via INTRAVENOUS
  Filled 2018-06-08: qty 1

## 2018-06-08 MED ORDER — IBUPROFEN 600 MG PO TABS: 600.0000 mg | ORAL_TABLET | Freq: Three times a day (TID) | ORAL | 0 refills | Status: DC | PRN

## 2018-06-08 MED ORDER — OXYCODONE-ACETAMINOPHEN 5-325 MG PO TABS: 1.0000 | ORAL_TABLET | ORAL | 0 refills | Status: DC | PRN

## 2018-06-09 ENCOUNTER — Other Ambulatory Visit: Payer: Self-pay | Admitting: Orthopedic Surgery

## 2018-06-09 DIAGNOSIS — S83241A Other tear of medial meniscus, current injury, right knee, initial encounter: Secondary | ICD-10-CM

## 2018-06-12 ENCOUNTER — Ambulatory Visit
Admission: RE | Admit: 2018-06-12 | Discharge: 2018-06-12 | Disposition: A | Discharge status: Home / Self Care | Payer: Medicare Other | Source: Ambulatory Visit | Attending: Orthopedic Surgery | Admitting: Orthopedic Surgery

## 2018-06-12 DIAGNOSIS — S83241A Other tear of medial meniscus, current injury, right knee, initial encounter: Secondary | ICD-10-CM

## 2018-06-16 ENCOUNTER — Telehealth: Payer: Self-pay | Admitting: Cardiovascular Disease

## 2018-06-16 ENCOUNTER — Emergency Department (HOSPITAL_COMMUNITY)
Admission: EM | Admit: 2018-06-16 | Discharge: 2018-06-16 | Disposition: A | Discharge status: Home / Self Care | Payer: Medicare Other | Attending: Emergency Medicine | Admitting: Emergency Medicine

## 2018-06-16 ENCOUNTER — Other Ambulatory Visit: Payer: Self-pay

## 2018-06-16 ENCOUNTER — Emergency Department (HOSPITAL_COMMUNITY): Payer: Medicare Other

## 2018-06-16 ENCOUNTER — Encounter (HOSPITAL_COMMUNITY): Payer: Self-pay | Admitting: *Deleted

## 2018-06-16 DIAGNOSIS — E876 Hypokalemia: Secondary | ICD-10-CM

## 2018-06-16 DIAGNOSIS — Z79899 Other long term (current) drug therapy: Secondary | ICD-10-CM | POA: Insufficient documentation

## 2018-06-16 DIAGNOSIS — I480 Paroxysmal atrial fibrillation: Secondary | ICD-10-CM | POA: Diagnosis not present

## 2018-06-16 DIAGNOSIS — Z87891 Personal history of nicotine dependence: Secondary | ICD-10-CM | POA: Insufficient documentation

## 2018-06-16 DIAGNOSIS — I4891 Unspecified atrial fibrillation: Secondary | ICD-10-CM

## 2018-06-16 LAB — CBC WITH DIFFERENTIAL/PLATELET
ABS IMMATURE GRANULOCYTES: 0.01 10*3/uL (ref 0.00–0.07)
BASOS PCT: 1 %
Basophils Absolute: 0 10*3/uL (ref 0.0–0.1)
Eosinophils Absolute: 0.1 10*3/uL (ref 0.0–0.5)
Eosinophils Relative: 2 %
HCT: 44.6 % (ref 36.0–46.0)
Hemoglobin: 14.6 g/dL (ref 12.0–15.0)
Immature Granulocytes: 0 %
Lymphocytes Relative: 37 %
Lymphs Abs: 2.3 10*3/uL (ref 0.7–4.0)
MCH: 31.3 pg (ref 26.0–34.0)
MCHC: 32.7 g/dL (ref 30.0–36.0)
MCV: 95.5 fL (ref 80.0–100.0)
MONOS PCT: 10 %
Monocytes Absolute: 0.6 10*3/uL (ref 0.1–1.0)
Neutro Abs: 3.1 10*3/uL (ref 1.7–7.7)
Neutrophils Relative %: 50 %
Platelets: 224 10*3/uL (ref 150–400)
RBC: 4.67 MIL/uL (ref 3.87–5.11)
RDW: 11.9 % (ref 11.5–15.5)
WBC: 6.2 10*3/uL (ref 4.0–10.5)
nRBC: 0 % (ref 0.0–0.2)

## 2018-06-16 LAB — BASIC METABOLIC PANEL
Anion gap: 6 (ref 5–15)
BUN: 21 mg/dL (ref 8–23)
CO2: 27 mmol/L (ref 22–32)
Calcium: 10.4 mg/dL — ABNORMAL HIGH (ref 8.9–10.3)
Chloride: 108 mmol/L (ref 98–111)
Creatinine, Ser: 0.69 mg/dL (ref 0.44–1.00)
GFR calc Af Amer: >60 mL/min (ref >60)
GFR calc non Af Amer: >60 mL/min (ref >60)
Glucose, Bld: 116 mg/dL — ABNORMAL HIGH (ref 70–99)
Potassium: 3.2 mmol/L — ABNORMAL LOW (ref 3.5–5.1)
Sodium: 141 mmol/L (ref 135–145)

## 2018-06-16 LAB — I-STAT TROPONIN, ED: Troponin i, poc: 0 ng/mL (ref 0.00–0.08)

## 2018-06-16 LAB — MAGNESIUM: MAGNESIUM: 1.9 mg/dL (ref 1.7–2.4)

## 2018-06-16 MED ORDER — POTASSIUM CHLORIDE CRYS ER 20 MEQ PO TBCR
40.0000 meq | EXTENDED_RELEASE_TABLET | Freq: Once | ORAL | Status: AC
  Administered 2018-06-16: 40 meq via ORAL
  Filled 2018-06-16: qty 2

## 2018-06-16 MED ORDER — APIXABAN 5 MG PO TABS: 5.0000 mg | ORAL_TABLET | Freq: Two times a day (BID) | ORAL | 0 refills | Status: DC

## 2018-06-16 MED ORDER — ETOMIDATE 2 MG/ML IV SOLN
INTRAVENOUS | Status: AC | PRN
  Administered 2018-06-16: 5 mg via INTRAVENOUS

## 2018-06-16 MED ORDER — ETOMIDATE 2 MG/ML IV SOLN
10.0000 mg | Freq: Once | INTRAVENOUS | Status: DC
  Filled 2018-06-16: qty 10

## 2018-06-16 MED ORDER — APIXABAN 5 MG PO TABS
5.0000 mg | ORAL_TABLET | Freq: Two times a day (BID) | ORAL | Status: DC
  Administered 2018-06-16: 5 mg via ORAL
  Filled 2018-06-16: qty 1

## 2018-06-16 MED ORDER — METOPROLOL TARTRATE 25 MG PO TABS: 12.5000 mg | ORAL_TABLET | ORAL | 0 refills | Status: DC | PRN

## 2018-06-16 MED ORDER — SODIUM CHLORIDE 0.9 % IV BOLUS
1000.0000 mL | Freq: Once | INTRAVENOUS | Status: AC
  Administered 2018-06-16: 1000 mL via INTRAVENOUS

## 2018-06-16 NOTE — ED Provider Notes (Addendum)
[facility] EMERGENCY DEPARTMENT Provider Note   CSN: [id number] Arrival date & time: 06/16/18  1426    History   Chief Complaint Chief Complaint  Patient presents with  . Palpitations    HPI TAWNIA SCHIRM is a 78 y.o. female.     Amber Mccarthy is a 78 y.o. female with a history of paroxysmal A. fib and colon polyps, otherwise healthy, who presents to the emergency department for evaluation of palpitations with a heart rate in the 150s.  She reports symptoms started suddenly at about 210 this afternoon while she was laying in bed.  She reports feeling lightheaded but no syncope.  She denies any associated chest pain or shortness of breath.  Heart palpitations have persisted since they begun.  She uses an apple watch and her heart rate has been quickly fluctuating but has remained above 100.  She denies any headache, vision changes, facial asymmetry, numbness or weakness associated with the symptoms.  No nausea, vomiting or abdominal pain.  Patient is followed by Dr. Royann Shivers, she reports that she was previously on Eliquis and had been prescribed metoprolol as needed for palpitations but is never had to use it any year ago Eliquis was discontinued as patient had no further episodes of A. fib until today.     Past Medical History:  Diagnosis Date  . Colon polyps   . PAF (paroxysmal atrial fibrillation) (HCC) 09/2014   a.  Echo 6/16:  Vigorous LVF, EF 65-70%, no RWMA, Gr 1 DD    Patient Active Problem List   Diagnosis Date Noted  . PAF (paroxysmal atrial fibrillation) (HCC) 10/04/2014    Past Surgical History:  Procedure Laterality Date  . CESAREAN SECTION  1987     OB History   No obstetric history on file.      Home Medications    Prior to Admission medications   Medication Sig Start Date End Date Taking? Authorizing Provider  apixaban (ELIQUIS) 5 MG TABS tablet Take 1 tablet (5 mg total) by mouth 2 (two) times daily for 30 days. 06/16/18  07/16/18  Dartha Lodge, PA-C  Calcium Carb-Cholecalciferol (CALCIUM-VITAMIN D) 500-200 MG-UNIT tablet Take 2 tablets by mouth daily.    [provider]  ibuprofen (ADVIL,MOTRIN) 600 MG tablet Take 1 tablet (600 mg total) by mouth every 8 (eight) hours as needed. 06/08/18   Azalia Bilis, MD  metoprolol tartrate (LOPRESSOR) 25 MG tablet Take 0.5 tablets (12.5 mg total) by mouth as needed. Take half a tablet as needed for palpitations or elevated heart rate 06/16/18   Dartha Lodge, PA-C  Multiple Vitamin (MULTIVITAMIN) capsule Take 1 capsule by mouth daily. Ultra Women 50+    [provider]  oxyCODONE-acetaminophen (PERCOCET/ROXICET) 5-325 MG tablet Take 1 tablet by mouth every 4 (four) hours as needed for severe pain. 06/08/18   Azalia Bilis, MD  vitamin B-12 (CYANOCOBALAMIN) 1000 MCG tablet Take 1,000 mcg by mouth daily.    [provider]    Family History Family History  Problem Relation Age of Onset  . Cancer Mother        angiosarcoma  . Alzheimer's disease Father     Social History Social History   Tobacco Use  . Smoking status: Former Games developer  . Smokeless tobacco: Never Used  . Tobacco comment: quit 1970s  Substance Use Topics  . Alcohol use: Yes    Alcohol/week: 7.0 standard drinks    Types: 7 Standard drinks or equivalent per week  Comment: 1 glass of wine per night--> since afib she has decreased  . Drug use: No     Allergies   Patient has no known allergies.   Review of Systems Review of Systems  Constitutional: Negative for chills and fever.  HENT: Negative.   Eyes: Negative for visual disturbance.  Respiratory: Negative for cough and shortness of breath.   Cardiovascular: Positive for palpitations. Negative for chest pain and leg swelling.  Gastrointestinal: Negative for abdominal pain, nausea and vomiting.  Genitourinary: Negative for dysuria and frequency.  Musculoskeletal: Negative for arthralgias and myalgias.  Skin:  Negative for color change and rash.  Neurological: Positive for light-headedness. Negative for dizziness, syncope and headaches.     Physical Exam Updated Vital Signs BP (!) 114/95 (BP Location: Left Arm)   Pulse (!) 110   Temp 97.7 F (36.5 C) (Oral)   Resp 14   SpO2 100%   Physical Exam Vitals signs and nursing note reviewed.  Constitutional:      General: She is not in acute distress.    Appearance: Normal appearance. She is well-developed and normal weight. She is not ill-appearing or diaphoretic.  HENT:     Head: Normocephalic and atraumatic.     Mouth/Throat:     Mouth: Mucous membranes are moist.     Pharynx: Oropharynx is clear.  Eyes:     General:        Right eye: No discharge.        Left eye: No discharge.     Pupils: Pupils are equal, round, and reactive to light.  Neck:     Musculoskeletal: Neck supple.  Cardiovascular:     Rate and Rhythm: Tachycardia present. Rhythm irregular.     Pulses: Normal pulses.     Heart sounds: Normal heart sounds. No murmur. No friction rub. No gallop.      Comments: Tachycardic to the 150s with irregularly irregular rhythm Pulmonary:     Effort: Pulmonary effort is normal. No respiratory distress.     Breath sounds: Normal breath sounds. No wheezing or rales.     Comments: Respirations equal and unlabored, patient able to speak in full sentences, lungs clear to auscultation bilaterally Abdominal:     General: Abdomen is flat. Bowel sounds are normal. There is no distension.     Palpations: Abdomen is soft. There is no mass.     Tenderness: There is no abdominal tenderness. There is no guarding.     Comments: Abdomen soft, nondistended, nontender to palpation in all quadrants without guarding or peritoneal signs  Musculoskeletal:        General: No deformity.     Right lower leg: No edema.     Left lower leg: No edema.  Skin:    General: Skin is warm and dry.     Capillary Refill: Capillary refill takes less than 2  seconds.  Neurological:     General: No focal deficit present.     Mental Status: She is alert.     Coordination: Coordination normal.     Comments: Speech is clear, able to follow commands CN III-XII intact Normal strength in upper and lower extremities bilaterally including dorsiflexion and plantar flexion, strong and equal grip strength Sensation normal to light and sharp touch Moves extremities without ataxia, coordination intact  Psychiatric:        Mood and Affect: Mood normal.        Behavior: Behavior normal.      ED Treatments /  Results  Labs (all labs ordered are listed, but only abnormal results are displayed) Labs Reviewed  BASIC METABOLIC PANEL - Abnormal; Notable for the following components:      Result Value   Potassium 3.2 (*)    Glucose, Bld 116 (*)    Calcium 10.4 (*)    All other components within normal limits  CBC WITH DIFFERENTIAL/PLATELET  MAGNESIUM  I-STAT TROPONIN, ED    EKG EKG Interpretation  Date/Time:  Monday June 16 2018 14:46:28 EST Ventricular Rate:  161 PR Interval:    QRS Duration: 88 QT Interval:  228 QTC Calculation: 373 R Axis:   64 Text Interpretation:  Atrial fibrillation with rapid V-rate Repolarization abnormality, prob rate related Confirmed by Benjiman Core [phone]) on 06/16/2018 3:23:54 PM   EKG Interpretation  Date/Time:  Monday June 16 2018 15:35:36 EST Ventricular Rate:  83 PR Interval:    QRS Duration: 96 QT Interval:  369 QTC Calculation: 434 R Axis:   68 Text Interpretation:  Normal sinus rhythm Artifact atrial fibrillation now resolved Confirmed by Pricilla Loveless [phone]) on 06/16/2018 4:28:04 PM       Radiology Dg Chest 2 View  Result Date: 06/16/2018 CLINICAL DATA:  Atrial fibrillation with rapid ventricular rate. EXAM: CHEST - 2 VIEW COMPARISON:  08/19/2006 FINDINGS: The heart size and mediastinal contours are within normal limits. Temporary pacing pads present. There is some scarring at the  left lung base. There is no evidence of pulmonary edema, consolidation, pneumothorax, nodule or pleural fluid. The visualized skeletal structures are unremarkable. IMPRESSION: No active cardiopulmonary disease. Electronically Signed   By: Irish Lack M.D.   On: 06/16/2018 16:00    Procedures .Cardioversion Date/Time: 06/16/2018 4:07 PM Performed by: Dartha Lodge, PA-C Authorized by: Dartha Lodge, PA-C   Consent:    Consent obtained:  Verbal   Consent given by:  Patient   Risks discussed:  Cutaneous burn, death, induced arrhythmia and pain   Alternatives discussed:  No treatment and rate-control medication Pre-procedure details:    Cardioversion basis:  Elective   Rhythm:  Atrial fibrillation   Electrode placement:  Anterior-lateral Patient sedated: Yes. Refer to sedation procedure documentation for details of sedation.  Attempt one:    Cardioversion mode:  Synchronous   Shock (Joules):  150   Shock outcome:  No change in rhythm Attempt two:    Cardioversion mode:  Synchronous   Shock (Joules):  200   Shock outcome:  Conversion to normal sinus rhythm Post-procedure details:    Patient status:  Awake   Patient tolerance of procedure:  Tolerated well, no immediate complications  .Critical Care Performed by: Dartha Lodge, PA-C Authorized by: Dartha Lodge, PA-C   Critical care provider statement:    Critical care time (minutes):  45   Critical care time was exclusive of:  Separately billable procedures and treating other patients   Critical care was necessary to treat or prevent imminent or life-threatening deterioration of the following conditions:  Cardiac failure (Afib w/ RVR)   Critical care was time spent personally by me on the following activities:  Discussions with consultants, evaluation of patient's response to treatment, examination of patient, ordering and performing treatments and interventions, ordering and review of laboratory studies, ordering and review  of radiographic studies, pulse oximetry, re-evaluation of patient's condition, obtaining history from patient or surrogate and review of old charts   I assumed direction of critical care for this patient from another provider in my specialty: no     (  including critical care time)  Medications Ordered in ED Medications  etomidate (AMIDATE) injection 10 mg (has no administration in time range)  apixaban (ELIQUIS) tablet 5 mg (5 mg Oral Given 06/16/18 1617)  sodium chloride 0.9 % bolus 1,000 mL (1,000 mLs Intravenous New Bag/Given 06/16/18 1456)  etomidate (AMIDATE) injection (5 mg Intravenous Given 06/16/18 1518)  potassium chloride SA (K-DUR,KLOR-CON) CR tablet 40 mEq (40 mEq Oral Given 06/16/18 1617)     Initial Impression / Assessment and Plan / ED Course  I have reviewed the triage vital signs and the nursing notes.  Pertinent labs & imaging results that were available during my care of the patient were reviewed by me and considered in my medical decision making (see chart for details).  Patient presents for evaluation of elevated heart rate and palpitations.  On arrival patient is tachycardic to the 150s and in A. fib on EKG.  She cannot pinpoint the moment that her symptoms began at 210 this afternoon, she reports associated lightheadedness but no chest pain or shortness of breath.  Patient had a very similar episode in 2016 and when she was diagnosed with A. fib but then had no further episodes of A. fib and was discontinued from her anticoagulation, she was given metoprolol as needed for palpitations but never had to take it.  We will get basic labs, troponin, EKG, chest x-ray and magnesium level.  Case discussed with Dr. Rubin Payor who recommends cardioversion, discussed with patient and husband and they are in agreement with this plan.   Procedural sedation performed by Dr. Rubin Payor with 5 mg of etomidate, and patient cardioverted, initially with 150 J but remained in A. fib, synchronized  shock again with 200 J and patient converted to sinus rhythm.  Tolerated procedure very well and is now alert and responsive, denying any chest pain or symptoms.  Labs overall reassuring with negative troponin.  No leukocytosis, normal hemoglobin, mild hypokalemia of 3.2 which was replaced with oral potassium replacement, glucose is slightly elevated 116 and calcium is 10.4, just 0.1 outside of normal, no intervention necessary, normal renal function.  Chest x-ray with no active cardiopulmonary disease.  This patients CHA2DS2-VASc Score and unadjusted Ischemic Stroke Rate (% per year) is equal to 3.2 % stroke rate/year from a score of 3. Patient will be restarted on elliquis  Patient observed in the emergency department and has remained in sinus rhythm with heart rate in the 80s she is feeling much better with no further sensation of palpitations.  Patient will be restarted on her Eliquis and given her first dose here in the emergency department.  30-day prescription and discount card provided.  Patient to follow-up with her cardiologist, I have also refilled her metoprolol to use as a pill in the pocket as needed for palpitations.  Patient and husband expressed understanding and agreement with plan and are stable for discharge home in good condition at this time.  Final Clinical Impressions(s) / ED Diagnoses   Final diagnoses:  Atrial fibrillation with RVR (HCC)  Hypokalemia    ED Discharge Orders         Ordered    apixaban (ELIQUIS) 5 MG TABS tablet  2 times daily     06/16/18 1636    metoprolol tartrate (LOPRESSOR) 25 MG tablet  As needed     06/16/18 1636           Legrand Rams 06/16/18 1651    Benjiman Core, MD 06/17/18 1608    Ala Dach,  Arva Chafe, PA-C 06/19/18 1454    Benjiman Core, MD 06/21/18 [phone]

## 2018-06-16 NOTE — Telephone Encounter (Signed)
Patient c/o Palpitations:  High priority if patient c/o lightheadedness, shortness of breath, or chest pain  1) How long have you had palpitations/irregular HR/ Afib? Are you having the symptoms now? 10 minutes   2) Are you currently experiencing lightheadedness, SOB or CP? Lightheadedness , minor SOB  3) Do you have a history of afib (atrial fibrillation) or irregular heart rhythm? Yes   4) Have you checked your BP or HR? (document readings if available):  80 on phone 149 at time of call 158 10 min ago 160 15 min   5) Are you experiencing any other symptoms? No  1 week prior, pt was in ED for knee pain and was given meds. Pt thinks meds may be causing rapid heart rate

## 2018-06-16 NOTE — Telephone Encounter (Signed)
Spoke with pt, she was lying on the bed resting and her heart rate has elevated above 150. She is looking at her apple watch and her pulse is all over the place from 54 bpm up to 174 bpm. She is no longer taking the eliquis or metoprolol amnd does not have any on hand. She reports a tingling in her left arm and fingers. She also reports SOB. Advised we need to catch the rhythm she is in and advised her to go to the ER. She resisted and wanted to be seen here but there are no available appointments and she agreed to go to the ER.

## 2018-06-16 NOTE — ED Notes (Signed)
5mg  etomidated given

## 2018-06-16 NOTE — Progress Notes (Signed)
ANTICOAGULATION CONSULT NOTE - Follow Up Consult  Pharmacy Consult for Eliquis Indication: atrial fibrillation  No Known Allergies  Patient Measurements: TBW 63.5 kg  Vital Signs: Temp: 97.7 F (36.5 C) (02/24 1435) Temp Source: Oral (02/24 1435) BP: 161/80 (02/24 1523) Pulse Rate: 112 (02/24 1523)  Labs: Recent Labs    06/16/18 1449  HGB 14.6  HCT 44.6  PLT 224    CrCl cannot be calculated (Patient's most recent lab result is older than the maximum 21 days allowed.).  Assessment: 78 yo F presents with Afib in RVR. Cardioverted in the ED today. Starting Eliquis.  Goal of Therapy:  Monitor platelets by anticoagulation protocol: Yes   Plan:  Start Eliquis 5mg  PO BID Plan for 30 days s/p cardioversion  Yoshiaki Kreuser J 06/16/2018,3:32 PM

## 2018-06-16 NOTE — ED Notes (Signed)
i50j given

## 2018-06-16 NOTE — ED Triage Notes (Signed)
Pt reports palpitations with HR at 150s with lightheadedness.  sxs onset was 35 minutes ago.  She denies cp, or sob.  She is A&Ox 4.  Hx of afib in the past and was on blood thinner, but was discontinued a year ago.  Her cards is Dr. Dot Lanes.

## 2018-06-16 NOTE — ED Notes (Signed)
afib rvr remains on the monitor. Shocked at 200j.

## 2018-06-16 NOTE — ED Notes (Signed)
Pt hr decreased to 110 at this time.

## 2018-06-16 NOTE — Discharge Instructions (Addendum)
You were cardioverted for your A. fib today.  You will need to take Eliquis for the next month at least but I would like for you to discuss this with your cardiologist.  I have also prescribed metoprolol for you to use as needed for heart palpitations, if you have palpitations you can take half a tablet if palpitations persist or worsen after this call your cardiologist office or present to the emergency department for further evaluation.  Return to the emergency department if you have chest pain, shortness of breath feel like you may pass out or have any other new or concerning symptoms.  Information on my medicine - ELIQUIS (apixaban)  WHY WAS ELIQUIS PRESCRIBED FOR YOU? Eliquis was prescribed for you to reduce the risk of forming blood clots that can cause a stroke after cardioversion on 2/24.  WHAT DO YOU NEED TO KNOW ABOUT ELIQUIS ? Take your Eliquis TWICE DAILY - one tablet in the morning and one tablet in the evening with or without food.  It would be best to take the doses about the same time each day.  If you have difficulty swallowing the tablet whole please discuss with your pharmacist how to take the medication safely.  Take Eliquis exactly as prescribed by your doctor and DO NOT stop taking Eliquis without talking to the doctor who prescribed the medication.  Stopping may increase your risk of developing a new clot or stroke.  Refill your prescription before you run out.  After discharge, you should have regular check-up appointments with your healthcare provider that is prescribing your Eliquis.  In the future your dose may need to be changed if your kidney function or weight changes by a significant amount or as you get older.  WHAT DO YOU DO IF YOU MISS A DOSE? If you miss a dose, take it as soon as you remember on the same day and resume taking twice daily.  Do not take more than one dose of ELIQUIS at the same time.  IMPORTANT SAFETY INFORMATION A possible side effect of  Eliquis is bleeding. You should call your healthcare provider right away if you experience any of the following: Bleeding from an injury or your nose that does not stop. Unusual colored urine (red or dark brown) or unusual colored stools (red or black). Unusual bruising for unknown reasons. A serious fall or if you hit your head (even if there is no bleeding).  Some medicines may interact with Eliquis and might increase your risk of bleeding or clotting while on Eliquis. To help avoid this, consult your healthcare provider or pharmacist prior to using any new prescription or non-prescription medications, including herbals, vitamins, non-steroidal anti-inflammatory drugs (NSAIDs) and supplements.  This website has more information on Eliquis (apixaban): [email or web address].com.cy.

## 2018-06-16 NOTE — ED Notes (Signed)
Patient verbalizes understanding of discharge instructions. Opportunity for questioning and answers were provided. 

## 2018-06-17 ENCOUNTER — Telehealth: Payer: Self-pay | Admitting: Cardiovascular Disease

## 2018-06-17 NOTE — Telephone Encounter (Signed)
Spoke with patient and regarding her heart rate. She did not feel bad with elevated heart rate but was in ED yesterday and cardioverted. Patient took Metoprolol 25 mg 1/2 and called Afib clinic, appointment tomorrow at 9:30. When called to tell patient her heart rate down in the 70's. Discussed with [name] PA and will have patient take an extra Metoprolol if HR above 100 at rest and SBP above 105. Reviewed recommendations with patient, verbalized understanding.

## 2018-06-17 NOTE — Telephone Encounter (Signed)
New Message         STAT if HR is under 50 or over 120 (normal HR is 60-100 beats per minute)  1) What is your heart rate? 102, 110, 112   2) Do you have a log of your heart rate readings (document readings)? 101,110, 112  3) Do you have any other symptoms? Tingling in her hands

## 2018-06-18 ENCOUNTER — Encounter (HOSPITAL_COMMUNITY): Payer: Self-pay | Admitting: Nurse Practitioner

## 2018-06-18 ENCOUNTER — Ambulatory Visit (HOSPITAL_COMMUNITY)
Admission: RE | Admit: 2018-06-18 | Discharge: 2018-06-18 | Disposition: A | Discharge status: Home / Self Care | Payer: Medicare Other | Source: Ambulatory Visit | Attending: Nurse Practitioner | Admitting: Nurse Practitioner

## 2018-06-18 VITALS — BP 122/84 | HR 94 | Ht 65.5 in | Wt 142.0 lb

## 2018-06-18 DIAGNOSIS — I480 Paroxysmal atrial fibrillation: Secondary | ICD-10-CM | POA: Insufficient documentation

## 2018-06-18 DIAGNOSIS — Z86010 Personal history of colonic polyps: Secondary | ICD-10-CM | POA: Insufficient documentation

## 2018-06-18 DIAGNOSIS — Z79899 Other long term (current) drug therapy: Secondary | ICD-10-CM | POA: Diagnosis not present

## 2018-06-18 DIAGNOSIS — Z87891 Personal history of nicotine dependence: Secondary | ICD-10-CM | POA: Diagnosis not present

## 2018-06-18 DIAGNOSIS — Z7901 Long term (current) use of anticoagulants: Secondary | ICD-10-CM | POA: Insufficient documentation

## 2018-06-18 LAB — BASIC METABOLIC PANEL
Anion gap: 9 (ref 5–15)
BUN: 14 mg/dL (ref 8–23)
CO2: 25 mmol/L (ref 22–32)
Calcium: 9.7 mg/dL (ref 8.9–10.3)
Chloride: 106 mmol/L (ref 98–111)
Creatinine, Ser: 0.69 mg/dL (ref 0.44–1.00)
GFR calc non Af Amer: >60 mL/min (ref >60)
Glucose, Bld: 97 mg/dL (ref 70–99)
POTASSIUM: 4.1 mmol/L (ref 3.5–5.1)
Sodium: 140 mmol/L (ref 135–145)

## 2018-06-18 NOTE — Progress Notes (Signed)
Primary Care Physician: Amber Palmer, MD Referring Physician: [facility] M[facility] f/u EP: Dr. Johney Mccarthy  Cardiologist: Dr. [name] is a 78 y.o. female with a h/o paroxysmal afib with a recent visit to the [facility] for Afib with RVR and received successful cardioversion. It came on very abruptly on Monday. I initially saw the pt in 2016 after a M[facility]arthy visit for new onsett Afib, which has been quiet and this is the first onset since then. No definite trigger. Had been seen in the Mccarthy the week before for a "locked up" knee, but to pt's knowledge, did not receive steroids. Her knee is back now to baseline.  In 2018, she saw Dr. Johney Mccarthy and since her afib burden was so low(none), he ok' ed stop of anticoagulation. Her CHA2DS2VASc is now 3 and since she had a cardioversion, she is now back on eliquis 5 mg bid.  She has metoprolol to use as needed but she did not think to take the day before she presented to the [facility]cc[facility]. Reports that she avoids caffeine, drinks maybe an oz of wine several nights a week.K+ was low at 3.2 in the Mccarthy and it was repleted.   Today, she denies symptoms of palpitations, chest pain, shortness of breath, orthopnea, PND, lower extremity edema, dizziness, presyncope, syncope, or neurologic sequela. The patient is tolerating medications without difficulties and is otherwise without complaint today.   Past Medical History:  Diagnosis Date  . Colon polyps   . PAF (paroxysmal atrial fibrillation) (HCC) 09/2014   a.  Echo 6/16:  Vigorous LVF, EF 65-70%, no RWMA, Gr 1 DD   Past Surgical History:  Procedure Laterality Date  . CESAREAN SECTION  1987    Current Outpatient Medications  Medication Sig Dispense Refill  . apixaban (ELIQUIS) 5 MG TABS tablet Take 1 tablet (5 mg total) by mouth 2 (two) times daily for 30 days. 60 tablet 0  . Calcium Carb-Cholecalciferol (CALCIUM-VITAMIN D) 500-200 MG-UNIT tablet Take 2 tablets by mouth daily.    Marland Kitchen ibuprofen (ADVIL,MOTRIN) 600 MG tablet Take 1  tablet (600 mg total) by mouth every 8 (eight) hours as needed. 15 tablet 0  . metoprolol tartrate (LOPRESSOR) 25 MG tablet Take 0.5 tablets (12.5 mg total) by mouth as needed. Take half a tablet as needed for palpitations or elevated heart rate 10 tablet 0  . Multiple Vitamin (MULTIVITAMIN) capsule Take 1 capsule by mouth daily. Ultra Women 50+    . oxyCODONE-acetaminophen (PERCOCET/ROXICET) 5-325 MG tablet Take 1 tablet by mouth every 4 (four) hours as needed for severe pain. 15 tablet 0  . vitamin B-12 (CYANOCOBALAMIN) 1000 MCG tablet Take 1,000 mcg by mouth daily.    . vitamin C (ASCORBIC ACID) 500 MG tablet Take 500 mg by mouth daily.     No current facility-administered medications for this encounter.     No Known Allergies  Social History   Socioeconomic History  . Marital status: Married    Spouse name: Amber Mccarthy  . Number of children: 1  . Years of education: Not on file  . Highest education level: Not on file  Occupational History  . Occupation: Doctor, general practice    Comment: Retired from [employer]  . Financial resource strain: Not on file  . Food insecurity:    Worry: Not on file    Inability: Not on file  . Transportation needs:    Medical: Not on file    Non-medical: Not on file  Tobacco Use  . Smoking status: Former Games developer  . Smokeless tobacco: Never Used  . Tobacco comment: quit 1970s  Substance and Sexual Activity  . Alcohol use: Yes    Alcohol/week: 7.0 standard drinks    Types: 7 Standard drinks or equivalent per week    Comment: 1 glass of wine per night--> since afib she has decreased  . Drug use: No  . Sexual activity: Not on file  Lifestyle  . Physical activity:    Days per week: Not on file    Minutes per session: Not on file  . Stress: Not on file  Relationships  . Social connections:    Talks on phone: Not on file    Gets together: Not on file    Attends religious service: Not on file    Active member of club or  organization: Not on file    Attends meetings of clubs or organizations: Not on file    Relationship status: Not on file  . Intimate partner violence:    Fear of current or ex partner: Not on file    Emotionally abused: Not on file    Physically abused: Not on file    Forced sexual activity: Not on file  Other Topics Concern  . Not on file  Social History Narrative   Pt lives in [place] with spouse.  Retired.  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    Family History  Problem Relation Age of Onset  . Cancer Mother        angiosarcoma  . Alzheimer's disease Father     ROS- All systems are reviewed and negative except as per the HPI above  Physical Exam: Vitals:   06/18/18 0927  BP: 122/84  Pulse: 94  Weight: 64.4 kg  Height: 5' 5.5" (1.664 m)   Wt Readings from Last 3 Encounters:  06/18/18 64.4 kg  06/07/18 63.5 kg  09/09/17 65.7 kg    Labs: Lab Results  Component Value Date   NA 140 06/18/2018   K 4.1 06/18/2018   CL 106 06/18/2018   CO2 25 06/18/2018   GLUCOSE 97 06/18/2018   BUN 14 06/18/2018   CREATININE 0.69 06/18/2018   CALCIUM 9.7 06/18/2018   MG 1.9 06/16/2018   No results found for: INR No results found for: CHOL, HDL, LDLCALC, TRIG   GEN- The patient is well appearing, alert and oriented x 3 today.   Head- normocephalic, atraumatic Eyes-  Sclera clear, conjunctiva pink Ears- hearing intact Oropharynx- clear Neck- supple, no JVP Lymph- no cervical lymphadenopathy Lungs- Clear to ausculation bilaterally, normal work of breathing Heart- Regular rate and rhythm, no murmurs, rubs or gallops, PMI not laterally displaced GI- soft, NT, ND, + BS Extremities- no clubbing, cyanosis, or edema MS- no significant deformity or atrophy Skin- no rash or lesion Psych- euthymic mood, full affect Neuro- strength and sensation are intact  EKG-NSR at 94 bpm, PR int 128 ms, qrs int 74 ms, qtc 427 ms Mccarthy/Epic records  reviewed    Assessment and Plan: 1. Paroxsymal afib No afib burden since 2016 until now S/p successful cardioversion in the [facility]ccarthy Discussed triggers and reviewed how to treat afib with prn metoprolol Discussed she can also use her apple watch to run strips if unsure of heart rate elevation  2. CHA2DS2VASc score of 3 Continue eliquis 5 mg bid per protocol of 4 weeks s/p cardioversion  Per guidelines she should continue past this, but  pt is interested to stop again if she has a long period of time without afib episodes She is very aware of when she goes into afib Bleeding precautions reviewed  F/u with Dr. Salena Saner 3/3 afib clinic as needed  Lupita Leash [facility] [address] [place], [place] [address] [phone]

## 2018-06-18 NOTE — Progress Notes (Signed)
Thanks, Donna MCr 

## 2018-06-24 ENCOUNTER — Ambulatory Visit: Payer: Medicare Other | Admitting: Cardiovascular Disease

## 2018-06-24 ENCOUNTER — Encounter: Payer: Self-pay | Admitting: Cardiovascular Disease

## 2018-06-24 VITALS — BP 122/82 | HR 78 | Ht 65.0 in | Wt 140.0 lb

## 2018-06-24 DIAGNOSIS — I480 Paroxysmal atrial fibrillation: Secondary | ICD-10-CM

## 2018-06-24 DIAGNOSIS — E876 Hypokalemia: Secondary | ICD-10-CM

## 2018-06-24 MED ORDER — APIXABAN 5 MG PO TABS: 5.0000 mg | ORAL_TABLET | Freq: Two times a day (BID) | ORAL | 3 refills | Status: DC

## 2018-06-24 NOTE — Patient Instructions (Signed)
Medication Instructions:  Continue Eliquis and all other current medications.  If you need a refill on your cardiac medications before your next appointment, please call your pharmacy.    Follow-Up: At [facility], you and your health needs are our priority.  As part of our continuing mission to provide you with exceptional heart care, we have created designated Provider Care Teams.  These Care Teams include your primary Cardiologist (physician) and Advanced Practice Providers (APPs -  Physician Assistants and Nurse Practitioners) who all work together to provide you with the care you need, when you need it. You will need a follow up appointment in 1 years.  Please call our office 2 months in advance to schedule this appointment.  You may see Dr. Royann Shivers or one of the following Advanced Practice Providers on your designated Care Team: Azalee Course, [place] . Micah Flesher, PA-C  Any Other Special Instructions Will Be Listed Below (If Applicable). None

## 2018-06-24 NOTE — Progress Notes (Signed)
Cardiology Office Note:    Date:  06/25/2018   ID:  Amber Mccarthy, DOB [birth year], MRN [id number]  PCP:  Amber Palmer, MD  Cardiologist:  Amber Fair, MD    Referring MD: Amber Palmer, MD   Chief complaint: atrial fibrillation  History of Present Illness:    Amber Mccarthy is a 78 y.o. female with a hx of paroxysmal atrial fibrillation, without clinically evident recurrence in years, until a recent episode that led to emergency room visit on June 16, 2018.  Since the arrhythmia had just started, she underwent direct-current cardioversion in the emergency room and has maintained sinus rhythm since then.  She reports being immediately aware of the arrhythmia, started while she was lying in bed.  She had a palpitations as well as lightheadedness but did not experience presyncope, chest pain or dyspnea.  Her apple watch confirmed the arrhythmia.  She went to the emergency room, the ECG clearly confirms atrial fibrillation with rapid ventricular response, as well as some repolarization abnormalities.  She had been under a lot of stress due to pain in her right knee for which she had even required opiate analgesics.  Also of note her potassium at the time of emergency room visit was low at 3.2, without clear etiology.  Potassium supplements were administered.  After her cardioversion, ECG shows sinus rhythm and the repolarization abnormalities were resolved.  Her ECG today is completely normal.  She is taking Eliquis and has not had any bleeding problems.  Prior to her right knee problems she continued to walk and exercise regularly and is very, very active for her age  Her only risk factors for embolic events are age and gender. She does not have vascular disease, hypertension diabetes, history of heart failure or stroke/TIA. Echo in 2016 showed normal left ventricular ejection fraction with EF 65-70% in the left atrium was described as normal in size.   She is a Education officer, museum, retired head of the speech center at [employer]  Past Medical History:  Diagnosis Date  . Colon polyps   . PAF (paroxysmal atrial fibrillation) (HCC) 09/2014   a.  Echo 6/16:  Vigorous LVF, EF 65-70%, no RWMA, Gr 1 DD    Past Surgical History:  Procedure Laterality Date  . CESAREAN SECTION  1987    Current Medications: Current Meds  Medication Sig  . apixaban (ELIQUIS) 5 MG TABS tablet Take 1 tablet (5 mg total) by mouth 2 (two) times daily for 30 days.  . Calcium Carb-Cholecalciferol (CALCIUM-VITAMIN D) 500-200 MG-UNIT tablet Take 2 tablets by mouth daily.  . metoprolol tartrate (LOPRESSOR) 25 MG tablet Take 0.5 tablets (12.5 mg total) by mouth as needed. Take half a tablet as needed for palpitations or elevated heart rate  . Multiple Vitamin (MULTIVITAMIN) capsule Take 1 capsule by mouth daily. Ultra Women 50+  . vitamin B-12 (CYANOCOBALAMIN) 1000 MCG tablet Take 1,000 mcg by mouth daily.  . vitamin C (ASCORBIC ACID) 500 MG tablet Take 500 mg by mouth daily.  . [DISCONTINUED] apixaban (ELIQUIS) 5 MG TABS tablet Take 1 tablet (5 mg total) by mouth 2 (two) times daily for 30 days.     Allergies:   Patient has no known allergies.   Social History   Socioeconomic History  . Marital status: Married    Spouse name: Amber Mccarthy  . Number of children: 1  . Years of education: Not on file  . Highest education level: Not on file  Occupational History  .  Occupation: Doctor, general practice    Comment: Retired from [employer]  . Financial resource strain: Not on file  . Food insecurity:    Worry: Not on file    Inability: Not on file  . Transportation needs:    Medical: Not on file    Non-medical: Not on file  Tobacco Use  . Smoking status: Former Games developer  . Smokeless tobacco: Never Used  . Tobacco comment: quit 1970s  Substance and Sexual Activity  . Alcohol use: Yes    Alcohol/week: 7.0 standard drinks    Types: 7 Standard drinks or equivalent per week     Comment: 1 glass of wine per night--> since afib she has decreased  . Drug use: No  . Sexual activity: Not on file  Lifestyle  . Physical activity:    Days per week: Not on file    Minutes per session: Not on file  . Stress: Not on file  Relationships  . Social connections:    Talks on phone: Not on file    Gets together: Not on file    Attends religious service: Not on file    Active member of club or organization: Not on file    Attends meetings of clubs or organizations: Not on file    Relationship status: Not on file  Other Topics Concern  . Not on file  Social History Narrative   Pt lives in [place] with spouse.  Retired.  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     Family History: The patient's family history includes Alzheimer's disease in her father; Cancer in her mother. ROS:   Please see the history of present illness.     All other systems reviewed and are negative.  EKGs/Labs/Other Studies Reviewed:    The following studies were reviewed today: Records from EMS, emergency room visit June 16, 2018 , atrial fibrillation clinic June 18, 2018.  EKG:  EKG is  ordered today.  Shows sinus rhythm, completely normal tracing.  Recent Labs: 06/16/2018: Hemoglobin 14.6; Magnesium 1.9; Platelets 224 06/18/2018: BUN 14; Creatinine, Ser 0.69; Potassium 4.1; Sodium 140   Recent Lipid Panel No results found for: CHOL, TRIG, HDL, CHOLHDL, VLDL, LDLCALC, LDLDIRECT  Physical Exam:    VS:  BP 122/82   Pulse 78   Ht 5\' 5"  (1.651 m)   Wt 140 lb (63.5 kg)   BMI 23.30 kg/m     Wt Readings from Last 3 Encounters:  06/24/18 140 lb (63.5 kg)  06/18/18 142 lb (64.4 kg)  06/07/18 140 lb (63.5 kg)      General: Alert, oriented x3, no distress, lean, fit, appears younger than stated age Head: no evidence of trauma, PERRL, EOMI, no exophtalmos or lid lag, no myxedema, no xanthelasma; normal ears, nose and oropharynx Neck: normal  jugular venous pulsations and no hepatojugular reflux; brisk carotid pulses without delay and no carotid bruits Chest: clear to auscultation, no signs of consolidation by percussion or palpation, normal fremitus, symmetrical and full respiratory excursions Cardiovascular: normal position and quality of the apical impulse, regular rhythm, normal first and second heart sounds, no murmurs, rubs or gallops Abdomen: no tenderness or distention, no masses by palpation, no abnormal pulsatility or arterial bruits, normal bowel sounds, no hepatosplenomegaly Extremities: no clubbing, cyanosis or edema; 2+ radial, ulnar and brachial pulses bilaterally; 2+ right femoral, posterior tibial and dorsalis pedis pulses; 2+ left femoral, posterior tibial and dorsalis pedis pulses; no subclavian or  femoral bruits Neurological: grossly nonfocal Psych: Normal mood and affect   ASSESSMENT:    1. PAF (paroxysmal atrial fibrillation) (HCC)   2. Hypokalemia    PLAN:    In order of problems listed above:  1. PAF: Clearly has had convincing atrial fibrillation, in the setting of pain and hypokalemia. CHADSVasc 3.  We again reviewed the relatively low risk of stroke but also the low risk of bleeding.  It is likely that the episodes of atrial fibrillation become more frequent and time and I advised long-term anticoagulation.  She reluctantly agreed.  Do not think she requires antiarrhythmics and since the episodes are very infrequent I think we can also avoid daily rate control medications.  Can continue with a strategy of beta-blocker as needed for episodes of rapid palpitations.  If she is on chronic anticoagulation, she does not need to go to the emergency room immediately for an episode of atrial fibrillation, but we can schedule elective cardioversion as an outpatient.  She had a normal TSH in February 2018 and this should probably be repeated.  I do not think we need to repeat an echocardiogram since her functional status  remains excellent and she had a normal work-up relatively recently. 2. Hypokalemia: This was not clearly explained that may have played a role in precipitating her arrhythmia.  It is worthwhile repeating.  She has a comprehensive physical scheduled with Dr. Paulino RilyWolters next month and can have a potassium level and TSH checked at that time.  Medication Adjustments/Labs and Tests Ordered: Current medicines are reviewed at length with the patient today.  Concerns regarding medicines are outlined above. Labs and tests ordered and medication changes are outlined in the patient instructions below:  Patient Instructions  Medication Instructions:  Continue Eliquis and all other current medications.  If you need a refill on your cardiac medications before your next appointment, please call your pharmacy.    Follow-Up: At [facility], you and your health needs are our priority.  As part of our continuing mission to provide you with exceptional heart care, we have created designated Provider Care Teams.  These Care Teams include your primary Cardiologist (physician) and Advanced Practice Providers (APPs -  Physician Assistants and Nurse Practitioners) who all work together to provide you with the care you need, when you need it. You will need a follow up appointment in 1 years.  Please call our office 2 months in advance to schedule this appointment.  You may see Dr. Royann Shiversroitoru or one of the following Advanced Practice Providers on your designated Care Team: Azalee CourseHao Meng, [place]-C . Micah FlesherAngela Duke, PA-C  Any Other Special Instructions Will Be Listed Below (If Applicable). None      Signed, Amber FairMihai Nakyra Bourn, MD  06/25/2018 8:33 AM    [facility]

## 2018-06-25 DIAGNOSIS — E876 Hypokalemia: Secondary | ICD-10-CM | POA: Insufficient documentation

## 2018-12-22 ENCOUNTER — Encounter

## 2019-03-24 ENCOUNTER — Encounter (INDEPENDENT_AMBULATORY_CARE_PROVIDER_SITE_OTHER): Payer: Self-pay | Admitting: Otolaryngology

## 2019-03-24 ENCOUNTER — Ambulatory Visit (INDEPENDENT_AMBULATORY_CARE_PROVIDER_SITE_OTHER): Payer: Medicare Other | Admitting: Otolaryngology

## 2019-03-24 ENCOUNTER — Other Ambulatory Visit: Payer: Self-pay

## 2019-03-24 VITALS — Temp 97.6°F

## 2019-03-24 DIAGNOSIS — H60312 Diffuse otitis externa, left ear: Secondary | ICD-10-CM | POA: Diagnosis not present

## 2019-03-24 DIAGNOSIS — H6123 Impacted cerumen, bilateral: Secondary | ICD-10-CM | POA: Diagnosis not present

## 2019-03-24 NOTE — Progress Notes (Signed)
HPI: Amber Mccarthy is a 78 y.o. female who presents for evaluation of left ear pain and discomfort.  Earlier the ear was blocked and she used some irrigation to try to clean the ear and relieve some of the blockage but she still has some moderate discomfort and partial blockage of the left ear. She swims 2 or 3 times a week and uses earplugs. She used a ear wash system to clean wax out of her left ear.  Past Medical History:  Diagnosis Date  . Colon polyps   . PAF (paroxysmal atrial fibrillation) (HCC) 09/2014   a.  Echo 6/16:  Vigorous LVF, EF 65-70%, no RWMA, Gr 1 DD   Past Surgical History:  Procedure Laterality Date  . CESAREAN SECTION  1987   Social History   Socioeconomic History  . Marital status: Married    Spouse name: [name]  . Number of children: 1  . Years of education: Not on file  . Highest education level: Not on file  Occupational History  . Occupation: Doctor, general practice    Comment: Retired from [employer]  . Financial resource strain: Not on file  . Food insecurity    Worry: Not on file    Inability: Not on file  . Transportation needs    Medical: Not on file    Non-medical: Not on file  Tobacco Use  . Smoking status: Former Smoker    Packs/day: 1.00    Years: 5.00    Pack years: 5.00    Start date: 1965    Quit date: 1970    Years since quitting: 50.9  . Smokeless tobacco: Never Used  . Tobacco comment: quit 1970s  Substance and Sexual Activity  . Alcohol use: Yes    Alcohol/week: 7.0 standard drinks    Types: 7 Standard drinks or equivalent per week    Comment: 1 glass of wine per night--> since afib she has decreased  . Drug use: No  . Sexual activity: Not on file  Lifestyle  . Physical activity    Days per week: Not on file    Minutes per session: Not on file  . Stress: Not on file  Relationships  . Social Musician on phone: Not on file    Gets together: Not on file    Attends religious service: Not on  file    Active member of club or organization: Not on file    Attends meetings of clubs or organizations: Not on file    Relationship status: Not on file  Other Topics Concern  . Not on file  Social History Narrative   Pt lives in [place] with spouse.  Retired.  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   Family History  Problem Relation Age of Onset  . Cancer Mother        angiosarcoma  . Alzheimer's disease Father    No Known Allergies Prior to Admission medications   Medication Sig Start Date End Date Taking? Authorizing Provider  Calcium Carb-Cholecalciferol (CALCIUM-VITAMIN D) 500-200 MG-UNIT tablet Take 2 tablets by mouth daily.   Yes [provider]  metoprolol tartrate (LOPRESSOR) 25 MG tablet Take 0.5 tablets (12.5 mg total) by mouth as needed. Take half a tablet as needed for palpitations or elevated heart rate 06/16/18  Yes Dartha Lodge, PA-C  Multiple Vitamin (MULTIVITAMIN) capsule Take 1 capsule by mouth daily. Ultra Women 50+   Yes  [provider]  vitamin B-12 (CYANOCOBALAMIN) 1000 MCG tablet Take 1,000 mcg by mouth daily.   Yes [provider]  vitamin C (ASCORBIC ACID) 500 MG tablet Take 500 mg by mouth daily.   Yes [provider]  apixaban (ELIQUIS) 5 MG TABS tablet Take 1 tablet (5 mg total) by mouth 2 (two) times daily for 30 days. 06/24/18 07/24/18  Croitoru, Mihai, MD     Positive ROS: Otherwise negative  All other systems have been reviewed and were otherwise negative with the exception of those mentioned in the HPI and as above.  Physical Exam: Constitutional: Alert, well-appearing, no acute distress Ears: External ears without lesions or tenderness.  Left ear canal revealed moderate inflammation and blockage adjacent to the left TM consistent with a fungal external otitis.  This was cleaned using a suction.  She had minimal wax buildup on the right side that was adjacent to the TM. Nasal:  External nose without lesions. Septum relatively straight. Clear nasal passages Oral: Lips and gums without lesions. Tongue and palate mucosa without lesions. Posterior oropharynx clear. Neck: No palpable adenopathy or masses Respiratory: Breathing comfortably  Skin: No facial/neck lesions or rash noted.  Cerumen impaction removal  Date/Time: 03/24/2019 1:57 PM Performed by: Rozetta Nunnery, MD Authorized by: Rozetta Nunnery, MD   Consent:    Consent obtained:  Verbal   Consent given by:  Patient   Risks discussed:  Pain and bleeding Procedure details:    Location:  L ear and R ear   Procedure type: curette and suction   Post-procedure details:    Inspection:  TM intact (Left ear canal with moderate external otitis consistent with fungal external otitis.)   Hearing quality:  Improved   Patient tolerance of procedure:  Tolerated well, no immediate complications Comments:     Left ear canal was cleaned with suction and hydroperoxide on.  Patient had a fungal external otitis that was cleaned down.  After cleaning the ear canal I applied gentian violet Ciprodex and CSF powder to the left ear only.    Assessment: Cerumen impaction Left external otitis  Plan: This was treated in the office.  She has antibiotic eardrops to use if she has any further ear pain as she has had  history of external otitis in the past from swimming. Also discussed use of alcohol or alcohol vinegar ear rinses after swimming. She will call us if she has any problems.  Radene Journey, MD

## 2019-04-21 ENCOUNTER — Telehealth (INDEPENDENT_AMBULATORY_CARE_PROVIDER_SITE_OTHER): Payer: Self-pay

## 2019-04-22 ENCOUNTER — Telehealth (INDEPENDENT_AMBULATORY_CARE_PROVIDER_SITE_OTHER): Payer: Self-pay

## 2019-05-04 ENCOUNTER — Ambulatory Visit: Payer: Medicare Other | Attending: Internal Medicine

## 2019-05-04 DIAGNOSIS — Z23 Encounter for immunization: Secondary | ICD-10-CM | POA: Insufficient documentation

## 2019-05-04 NOTE — Progress Notes (Signed)
   Covid-19 Vaccination Clinic  Name:  Amber Mccarthy    MRN: [id number] DOB: [birth year]  05/04/2019  Amber Mccarthy was observed post Covid-19 immunization for 30 minutes based on pre-vaccination screening without incidence. She was provided with Vaccine Information Sheet and instruction to access the V-Safe system.   Amber Mccarthy was instructed to call 911 with any severe reactions post vaccine: Marland Kitchen Difficulty breathing  . Swelling of your face and throat  . A fast heartbeat  . A bad rash all over your body  . Dizziness and weakness

## 2019-05-24 ENCOUNTER — Ambulatory Visit: Payer: Medicare PPO | Attending: Internal Medicine

## 2019-05-24 DIAGNOSIS — Z23 Encounter for immunization: Secondary | ICD-10-CM | POA: Insufficient documentation

## 2019-05-24 NOTE — Progress Notes (Signed)
   Covid-19 Vaccination Clinic  Name:  Amber Mccarthy    MRN: [id number] DOB: [birth year]  05/24/2019  [name] was observed post Covid-19 immunization for 15 minutes without incidence. She was provided with Vaccine Information Sheet and instruction to access the V-Safe system.   [name] was instructed to call 911 with any severe reactions post vaccine: Marland Kitchen Difficulty breathing  . Swelling of your face and throat  . A fast heartbeat  . A bad rash all over your body  . Dizziness and weakness    Immunizations Administered    Name Date Dose VIS Date Route   Pfizer COVID-19 Vaccine 05/24/2019 10:03 AM 0.3 mL 04/03/2019 Intramuscular   Manufacturer: [employer], [employer]   Lot: [id number]   NDC: [address]-0

## 2019-06-26 ENCOUNTER — Encounter: Payer: Self-pay | Admitting: Cardiovascular Disease

## 2019-06-26 ENCOUNTER — Ambulatory Visit: Payer: Medicare PPO | Admitting: Cardiovascular Disease

## 2019-06-26 ENCOUNTER — Other Ambulatory Visit: Payer: Self-pay

## 2019-06-26 VITALS — BP 124/75 | HR 70 | Temp 97.5°F | Ht 65.0 in | Wt 146.6 lb

## 2019-06-26 DIAGNOSIS — I480 Paroxysmal atrial fibrillation: Secondary | ICD-10-CM

## 2019-06-26 NOTE — Patient Instructions (Signed)

## 2019-06-26 NOTE — Progress Notes (Signed)
Cardiology Office Note:    Date:  06/26/2019   ID:  Amber Mccarthy, DOB 1940-05-03, MRN [id number]  PCP:  Glenis Smoker, MD  Cardiologist:  Sanda Klein, MD    Referring MD: Glenis Smoker, *   Chief complaint: atrial fibrillation  History of Present Illness:    Amber Mccarthy is a 79 y.o. female with a hx of paroxysmal atrial fibrillation, without clinically evident recurrence in years, until a recent episode that led to emergency room visit on June 16, 2018.  Since the arrhythmia had just started, she underwent direct-current cardioversion in the emergency room and has maintained sinus rhythm since then.  It is not clear why, but she was hypokalemic at the time.  For a few weeks following that she had relatively frequent episodes of brief palpitations that she was able to abort with rest and as needed metoprolol.  Over the last 10 or 11 months she has had very few if any palpitations.  She has an apple watch that has consistently shown normal rhythm.  She is in normal sinus rhythm today.  She has been compliant with anticoagulation and has not had any bleeding problems.  Denies any focal neurological complaints.  She continues to be very physically active.  She walks 2 to 3 miles daily.  She says that she has a little more shortness of breath than she did years ago, but thinks it is appropriate for her age.  Her only risk factors for embolic events are age and gender. She does not have vascular disease, hypertension diabetes, history of heart failure or stroke/TIA. Echo in 2016 showed normal left ventricular ejection fraction with EF 65-70% in the left atrium was described as normal in size.   Is planning to undergo a follow-up colonoscopy soon.  She used to see Dr. Amedeo Plenty but now is looking for a new gastroenterologist.  She is a Electrical engineer, retired head of the speech center at [employer]  Past Medical History:  Diagnosis Date  . Colon polyps   . PAF  (paroxysmal atrial fibrillation) ([facility]) 09/2014   a.  Echo 6/16:  Vigorous LVF, EF 65-70%, no RWMA, Gr 1 DD    Past Surgical History:  Procedure Laterality Date  . CESAREAN SECTION  1987    Current Medications: Current Meds  Medication Sig  . Calcium Carb-Cholecalciferol (CALCIUM-VITAMIN D) 500-200 MG-UNIT tablet Take 2 tablets by mouth daily.  . dorzolamide-timolol (COSOPT) 22.3-6.8 MG/ML ophthalmic solution   . latanoprost (XALATAN) 0.005 % ophthalmic solution   . metoprolol tartrate (LOPRESSOR) 25 MG tablet Take 0.5 tablets (12.5 mg total) by mouth as needed. Take half a tablet as needed for palpitations or elevated heart rate  . Multiple Vitamin (MULTIVITAMIN) capsule Take 1 capsule by mouth daily. Ultra Women 50+  . vitamin B-12 (CYANOCOBALAMIN) 1000 MCG tablet Take 1,000 mcg by mouth daily.  . vitamin C (ASCORBIC ACID) 500 MG tablet Take 500 mg by mouth daily.     Allergies:   Patient has no known allergies.   Social History   Socioeconomic History  . Marital status: Married    Spouse name: Marlyce Huge  . Number of children: 1  . Years of education: Not on file  . Highest education level: Not on file  Occupational History  . Occupation: Electrical engineer    Comment: Retired from [employer]  . Smoking status: Former Smoker    Packs/day: 1.00    Years: 5.00    Pack years:  5.00    Start date: 28    Quit date: 1970    Years since quitting: 51.2  . Smokeless tobacco: Never Used  . Tobacco comment: quit 1970s  Substance and Sexual Activity  . Alcohol use: Yes    Alcohol/week: 7.0 standard drinks    Types: 7 Standard drinks or equivalent per week    Comment: 1 glass of wine per night--> since afib she has decreased  . Drug use: No  . Sexual activity: Not on file  Other Topics Concern  . Not on file  Social History Narrative   Pt lives in [place] with spouse.  Retired.  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    Social Determinants of Health   Financial Resource Strain:   . Difficulty of Paying Living Expenses: Not on file  Food Insecurity:   . Worried About Programme researcher, broadcasting/film/video in the Last Year: Not on file  . Ran Out of Food in the Last Year: Not on file  Transportation Needs:   . Lack of Transportation (Medical): Not on file  . Lack of Transportation (Non-Medical): Not on file  Physical Activity:   . Days of Exercise per Week: Not on file  . Minutes of Exercise per Session: Not on file  Stress:   . Feeling of Stress : Not on file  Social Connections:   . Frequency of Communication with Friends and Family: Not on file  . Frequency of Social Gatherings with Friends and Family: Not on file  . Attends Religious Services: Not on file  . Active Member of Clubs or Organizations: Not on file  . Attends Banker Meetings: Not on file  . Marital Status: Not on file     Family History: The patient's family history includes Alzheimer's disease in her father; Cancer in her mother. ROS:   Please see the history of present illness.     All other systems reviewed and are negative.  EKGs/Labs/Other Studies Reviewed:    The following studies were reviewed today:  EKG:  EKG is  ordered today.  Shows normal sinus rhythm and is a completely normal tracing.  Recent Labs: No results found for requested labs within last 8760 hours.   Recent Lipid Panel No results found for: CHOL, TRIG, HDL, CHOLHDL, VLDL, LDLCALC, LDLDIRECT  Physical Exam:    VS:  BP 124/75   Pulse 70   Temp (!) 97.5 F (36.4 C)   Ht 5\' 5"  (1.651 m)   Wt 146 lb 9.6 oz (66.5 kg)   SpO2 97%   BMI 24.40 kg/m     Wt Readings from Last 3 Encounters:  06/26/19 146 lb 9.6 oz (66.5 kg)  06/24/18 140 lb (63.5 kg)  06/18/18 142 lb (64.4 kg)     General: Alert, oriented x3, no distress, she appears lean, fit and younger than her stated age. Head: no evidence of trauma, PERRL, EOMI, no exophtalmos or lid lag, no  myxedema, no xanthelasma; normal ears, nose and oropharynx Neck: normal jugular venous pulsations and no hepatojugular reflux; brisk carotid pulses without delay and no carotid bruits Chest: clear to auscultation, no signs of consolidation by percussion or palpation, normal fremitus, symmetrical and full respiratory excursions Cardiovascular: normal position and quality of the apical impulse, regular rhythm, normal first and second heart sounds, no murmurs, rubs or gallops Abdomen: no tenderness or distention, no masses by palpation, no abnormal pulsatility or arterial bruits, normal bowel sounds,  no hepatosplenomegaly Extremities: no clubbing, cyanosis or edema; 2+ radial, ulnar and brachial pulses bilaterally; 2+ right femoral, posterior tibial and dorsalis pedis pulses; 2+ left femoral, posterior tibial and dorsalis pedis pulses; no subclavian or femoral bruits Neurological: grossly nonfocal Psych: Normal mood and affect   ASSESSMENT:    1. PAF (paroxysmal atrial fibrillation) ([facility])    PLAN:    In order of problems listed above:  1. PAF: CHA2DS2-VASc 3 (age and gender).  Compliant with anticoagulation.  Very low burden of symptoms.  Tolerating anticoagulants without bleeding.  Suggested and encouraged that she continue these to reduce the risk of stroke and also since treatment of breakthrough events will be more straightforward.  Temporarily interrupting anticoagulation for colonoscopy would be a low risk proposition.  Stop it 48 hours before the procedure.  No indication for antiarrhythmics or ablation at this point. 2. Hypokalemia: Not clearly explained, but has resolved and has not recurred.  Medication Adjustments/Labs and Tests Ordered: Current medicines are reviewed at length with the patient today.  Concerns regarding medicines are outlined above. Labs and tests ordered and medication changes are outlined in the patient instructions below:  Patient Instructions  Medication  Instructions:  No changes *If you need a refill on your cardiac medications before your next appointment, please call your pharmacy*   Lab Work: None ordered If you have labs (blood work) drawn today and your tests are completely normal, you will receive your results only by: Marland Kitchen MyChart Message (if you have MyChart) OR . A paper copy in the mail If you have any lab test that is abnormal or we need to change your treatment, we will call you to review the results.   Testing/Procedures: None ordered   Follow-Up: At [facility], you and your health needs are our priority.  As part of our continuing mission to provide you with exceptional heart care, we have created designated Provider Care Teams.  These Care Teams include your primary Cardiologist (physician) and Advanced Practice Providers (APPs -  Physician Assistants and Nurse Practitioners) who all work together to provide you with the care you need, when you need it.  We recommend signing up for the patient portal called "MyChart".  Sign up information is provided on this After Visit Summary.  MyChart is used to connect with patients for Virtual Visits (Telemedicine).  Patients are able to view lab/test results, encounter notes, upcoming appointments, etc.  Non-urgent messages can be sent to your provider as well.   To learn more about what you can do with MyChart, go to [email or web address].    Your next appointment:   12 month(s)  The format for your next appointment:   In Person  Provider:   You may see Thurmon Fair, MD or one of the following Advanced Practice Providers on your designated Care Team:    Azalee Course, PA-C  Micah Flesher, [place] or   Judy Pimple, PA-C      Signed, Thurmon Fair, MD  06/26/2019 1:51 PM    [facility]

## 2019-08-26 DIAGNOSIS — I788 Other diseases of capillaries: Secondary | ICD-10-CM | POA: Diagnosis not present

## 2019-08-26 DIAGNOSIS — L570 Actinic keratosis: Secondary | ICD-10-CM | POA: Diagnosis not present

## 2019-08-26 DIAGNOSIS — Z85828 Personal history of other malignant neoplasm of skin: Secondary | ICD-10-CM | POA: Diagnosis not present

## 2019-09-06 ENCOUNTER — Other Ambulatory Visit: Payer: Self-pay | Admitting: Cardiovascular Disease

## 2019-09-17 DIAGNOSIS — J019 Acute sinusitis, unspecified: Secondary | ICD-10-CM | POA: Diagnosis not present

## 2019-09-25 ENCOUNTER — Other Ambulatory Visit (INDEPENDENT_AMBULATORY_CARE_PROVIDER_SITE_OTHER): Payer: Self-pay | Admitting: Otolaryngology

## 2019-10-06 ENCOUNTER — Ambulatory Visit (INDEPENDENT_AMBULATORY_CARE_PROVIDER_SITE_OTHER): Payer: Medicare PPO | Admitting: Otolaryngology

## 2019-10-06 ENCOUNTER — Other Ambulatory Visit: Payer: Self-pay

## 2019-10-06 ENCOUNTER — Encounter (INDEPENDENT_AMBULATORY_CARE_PROVIDER_SITE_OTHER): Payer: Self-pay | Admitting: Otolaryngology

## 2019-10-06 VITALS — Temp 97.3°F

## 2019-10-06 DIAGNOSIS — H60312 Diffuse otitis externa, left ear: Secondary | ICD-10-CM | POA: Diagnosis not present

## 2019-10-06 NOTE — Progress Notes (Signed)
HPI: Amber Mccarthy is a 79 y.o. female who returns today for evaluation of left ear complaints.  The left ear was stopped up and she was seeing some black fungus and.  She used eardrops which was leftover Cipro.  She is feeling much better today..  Past Medical History:  Diagnosis Date  . Colon polyps   . PAF (paroxysmal atrial fibrillation) ([place]) 09/2014   a.  Echo 6/16:  Vigorous LVF, EF 65-70%, no RWMA, Gr 1 DD   Past Surgical History:  Procedure Laterality Date  . CESAREAN SECTION  1987   Social History   Socioeconomic History  . Marital status: Married    Spouse name: Marlyce Huge  . Number of children: 1  . Years of education: Not on file  . Highest education level: Not on file  Occupational History  . Occupation: Electrical engineer    Comment: Retired from [employer]  . Smoking status: Former Smoker    Packs/day: 1.00    Years: 5.00    Pack years: 5.00    Start date: 1965    Quit date: 1970    Years since quitting: 51.4  . Smokeless tobacco: Never Used  . Tobacco comment: quit 1970s  Substance and Sexual Activity  . Alcohol use: Yes    Alcohol/week: 7.0 standard drinks    Types: 7 Standard drinks or equivalent per week    Comment: 1 glass of wine per night--> since afib she has decreased  . Drug use: No  . Sexual activity: Not on file  Other Topics Concern  . Not on file  Social History Narrative   Pt lives in [place] with spouse.  Retired.  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   Social Determinants of Health   Financial Resource Strain:   . Difficulty of Paying Living Expenses:   Food Insecurity:   . Worried About Charity fundraiser in the Last Year:   . Arboriculturist in the Last Year:   Transportation Needs:   . Film/video editor (Medical):   Marland Kitchen Lack of Transportation (Non-Medical):   Physical Activity:   . Days of Exercise per Week:   . Minutes of Exercise per Session:   Stress:   .  Feeling of Stress :   Social Connections:   . Frequency of Communication with Friends and Family:   . Frequency of Social Gatherings with Friends and Family:   . Attends Religious Services:   . Active Member of Clubs or Organizations:   . Attends Archivist Meetings:   Marland Kitchen Marital Status:    Family History  Problem Relation Age of Onset  . Cancer Mother        angiosarcoma  . Alzheimer's disease Father    No Known Allergies Prior to Admission medications   Medication Sig Start Date End Date Taking? Authorizing Provider  Calcium Carb-Cholecalciferol (CALCIUM-VITAMIN D) 500-200 MG-UNIT tablet Take 2 tablets by mouth daily.    [provider]  dorzolamide-timolol (COSOPT) 22.3-6.8 MG/ML ophthalmic solution  05/22/19   [provider]  ELIQUIS 5 MG TABS tablet TAKE 1 TABLET (5 MG TOTAL) BY MOUTH 2 (TWO) TIMES DAILY FOR 30 DAYS. 09/07/19   Croitoru, Mihai, MD  latanoprost (XALATAN) 0.005 % ophthalmic solution  04/30/19   [provider]  metoprolol tartrate (LOPRESSOR) 25 MG tablet Take 0.5 tablets (12.5 mg total) by mouth as needed. Take half a tablet as needed  for palpitations or elevated heart rate 06/16/18   Dartha Lodge, PA-C  Multiple Vitamin (MULTIVITAMIN) capsule Take 1 capsule by mouth daily. Ultra Women 50+    [provider]  vitamin B-12 (CYANOCOBALAMIN) 1000 MCG tablet Take 1,000 mcg by mouth daily.    [provider]  vitamin C (ASCORBIC ACID) 500 MG tablet Take 500 mg by mouth daily.    [provider]     Positive ROS: Otherwise negative  All other systems have been reviewed and were otherwise negative with the exception of those mentioned in the HPI and as above.  Physical Exam: Constitutional: Alert, well-appearing, no acute distress Ears: External ears without lesions or tenderness.  Ear canals are clear bilaterally.  She had a small amount of crusting anteriorly inferiorly on the left TM but this is very  minimal and not obstructing the TM.  She is little bit sensitive to have this removed and was left alone.  But there is no sign of external otitis or fungus infection and the TMs were clear bilaterally. Nasal: External nose without lesions. Clear nasal passages Oral: Lips and gums without lesions. Tongue and palate mucosa without lesions. Posterior oropharynx clear. Neck: No palpable adenopathy or masses Respiratory: Breathing comfortably  Skin: No facial/neck lesions or rash noted.  Procedures  Assessment: Resolved left external otitis having used Cipro.  Plan: She wanted to go swimming again and I discussed with her concerning keeping the ears dry with either hairdryer or alcohol and vinegar ear rinses after swimming.  She does not necessarily need a ear plug which she is used in the past.  She will follow-up as needed any further ear problems.   Narda Bonds, MD

## 2019-10-09 DIAGNOSIS — E559 Vitamin D deficiency, unspecified: Secondary | ICD-10-CM | POA: Diagnosis not present

## 2019-10-09 DIAGNOSIS — Z0000 Encounter for general adult medical examination without abnormal findings: Secondary | ICD-10-CM | POA: Diagnosis not present

## 2019-10-09 DIAGNOSIS — I480 Paroxysmal atrial fibrillation: Secondary | ICD-10-CM | POA: Diagnosis not present

## 2019-10-09 DIAGNOSIS — Z5181 Encounter for therapeutic drug level monitoring: Secondary | ICD-10-CM | POA: Diagnosis not present

## 2019-10-09 DIAGNOSIS — E7800 Pure hypercholesterolemia, unspecified: Secondary | ICD-10-CM | POA: Diagnosis not present

## 2019-11-09 DIAGNOSIS — H00021 Hordeolum internum right upper eyelid: Secondary | ICD-10-CM | POA: Diagnosis not present

## 2019-11-09 DIAGNOSIS — H0102B Squamous blepharitis left eye, upper and lower eyelids: Secondary | ICD-10-CM | POA: Diagnosis not present

## 2019-11-09 DIAGNOSIS — H0102A Squamous blepharitis right eye, upper and lower eyelids: Secondary | ICD-10-CM | POA: Diagnosis not present

## 2019-11-23 DIAGNOSIS — H00021 Hordeolum internum right upper eyelid: Secondary | ICD-10-CM | POA: Diagnosis not present

## 2019-11-23 DIAGNOSIS — H00031 Abscess of right upper eyelid: Secondary | ICD-10-CM | POA: Diagnosis not present

## 2019-11-30 DIAGNOSIS — H401111 Primary open-angle glaucoma, right eye, mild stage: Secondary | ICD-10-CM | POA: Diagnosis not present

## 2019-11-30 DIAGNOSIS — H00021 Hordeolum internum right upper eyelid: Secondary | ICD-10-CM | POA: Diagnosis not present

## 2019-11-30 DIAGNOSIS — D3132 Benign neoplasm of left choroid: Secondary | ICD-10-CM | POA: Diagnosis not present

## 2019-11-30 DIAGNOSIS — Z961 Presence of intraocular lens: Secondary | ICD-10-CM | POA: Diagnosis not present

## 2019-11-30 DIAGNOSIS — H04123 Dry eye syndrome of bilateral lacrimal glands: Secondary | ICD-10-CM | POA: Diagnosis not present

## 2019-11-30 DIAGNOSIS — H0102A Squamous blepharitis right eye, upper and lower eyelids: Secondary | ICD-10-CM | POA: Diagnosis not present

## 2019-11-30 DIAGNOSIS — H401123 Primary open-angle glaucoma, left eye, severe stage: Secondary | ICD-10-CM | POA: Diagnosis not present

## 2019-11-30 DIAGNOSIS — H0102B Squamous blepharitis left eye, upper and lower eyelids: Secondary | ICD-10-CM | POA: Diagnosis not present

## 2019-11-30 DIAGNOSIS — H26492 Other secondary cataract, left eye: Secondary | ICD-10-CM | POA: Diagnosis not present

## 2019-12-11 DIAGNOSIS — R0982 Postnasal drip: Secondary | ICD-10-CM | POA: Diagnosis not present

## 2019-12-11 DIAGNOSIS — H40009 Preglaucoma, unspecified, unspecified eye: Secondary | ICD-10-CM | POA: Diagnosis not present

## 2019-12-31 DIAGNOSIS — Z20822 Contact with and (suspected) exposure to covid-19: Secondary | ICD-10-CM | POA: Diagnosis not present

## 2020-01-01 DIAGNOSIS — H0011 Chalazion right upper eyelid: Secondary | ICD-10-CM | POA: Diagnosis not present

## 2020-01-05 DIAGNOSIS — R29898 Other symptoms and signs involving the musculoskeletal system: Secondary | ICD-10-CM | POA: Diagnosis not present

## 2020-01-05 DIAGNOSIS — Z23 Encounter for immunization: Secondary | ICD-10-CM | POA: Diagnosis not present

## 2020-01-06 ENCOUNTER — Other Ambulatory Visit: Payer: Self-pay | Admitting: Family Medicine

## 2020-01-06 DIAGNOSIS — R29898 Other symptoms and signs involving the musculoskeletal system: Secondary | ICD-10-CM

## 2020-01-11 ENCOUNTER — Ambulatory Visit
Admission: RE | Admit: 2020-01-11 | Discharge: 2020-01-11 | Disposition: A | Discharge status: Home / Self Care | Payer: Medicare PPO | Source: Ambulatory Visit | Attending: Family Medicine | Admitting: Family Medicine

## 2020-01-11 DIAGNOSIS — R29898 Other symptoms and signs involving the musculoskeletal system: Secondary | ICD-10-CM

## 2020-01-25 DIAGNOSIS — M48062 Spinal stenosis, lumbar region with neurogenic claudication: Secondary | ICD-10-CM | POA: Diagnosis not present

## 2020-01-27 DIAGNOSIS — M545 Low back pain, unspecified: Secondary | ICD-10-CM | POA: Diagnosis not present

## 2020-02-18 DIAGNOSIS — H401122 Primary open-angle glaucoma, left eye, moderate stage: Secondary | ICD-10-CM | POA: Diagnosis not present

## 2020-02-21 DIAGNOSIS — M5127 Other intervertebral disc displacement, lumbosacral region: Secondary | ICD-10-CM | POA: Diagnosis not present

## 2020-02-21 DIAGNOSIS — M47817 Spondylosis without myelopathy or radiculopathy, lumbosacral region: Secondary | ICD-10-CM | POA: Diagnosis not present

## 2020-02-21 DIAGNOSIS — M545 Low back pain, unspecified: Secondary | ICD-10-CM | POA: Diagnosis not present

## 2020-02-25 DIAGNOSIS — M545 Low back pain, unspecified: Secondary | ICD-10-CM | POA: Diagnosis not present

## 2020-03-07 DIAGNOSIS — K2100 Gastro-esophageal reflux disease with esophagitis, without bleeding: Secondary | ICD-10-CM | POA: Diagnosis not present

## 2020-03-07 DIAGNOSIS — Z86010 Personal history of colonic polyps: Secondary | ICD-10-CM | POA: Diagnosis not present

## 2020-03-10 ENCOUNTER — Telehealth: Payer: Self-pay

## 2020-03-10 NOTE — Telephone Encounter (Signed)
   [facility] Pre-operative Risk Assessment    Request for surgical clearance:  1. What type of surgery is being performed? COLONOSCOPY   2. When is this surgery scheduled? 05-02-2020   3. What type of clearance is required (medical clearance vs. Pharmacy clearance to hold med vs. Both)? MEDICATION  4. Are there any medications that need to be held prior to surgery and how long? ELIQUIS    5. Practice name and name of physician performing surgery? [facility]  DR BRAHMBHATT   6. What is the office phone number? [phone]   7.   What is the office fax number? [phone]  8.   Anesthesia type (None, local, MAC, general) ? NOT LISTED

## 2020-03-10 NOTE — Telephone Encounter (Signed)
Unable to leave a message- mailbox full.  Corine Shelter PA-C 03/10/2020 2:57 PM

## 2020-03-10 NOTE — Telephone Encounter (Signed)
Please comment on holding anticoagulation in this patient and then will contact her for preop clearance.  Corine Shelter PA-C 03/10/2020 1:38 PM

## 2020-03-10 NOTE — Telephone Encounter (Signed)
Patient with diagnosis of afib on Eliquis for anticoagulation.    Procedure: colonoscopy Date of procedure: 05/02/20  CHA2DS2-VASc Score = 3  This indicates a 3.2% annual risk of stroke. The patient's score is based upon: CHF History: 0 HTN History: 0 Diabetes History: 0 Stroke History: 0 Vascular Disease History: 0 Age Score: 2 Gender Score: 1  CrCl 74mL/min Platelet count 224K  Per office protocol, patient can hold Eliquis for 1-2 days prior to procedure.

## 2020-03-15 NOTE — Telephone Encounter (Signed)
   Primary Cardiologist: Thurmon Fair, MD  Chart reviewed as part of pre-operative protocol coverage. Patient has colonoscopy planned for 05/02/2018 and we were asked to give our recommendations for holding Eliquis.   Per Pharmacy and office protocol, patient can hold Eliquis for 1-2 days prior to procedure. This should be resumed as soon as able following procedure.    I will route this recommendation to the requesting party via Epic fax function and remove from pre-op pool.  Please call with questions.  Corrin Parker, PA-C 03/15/2020, 9:05 AM

## 2020-03-24 DIAGNOSIS — L308 Other specified dermatitis: Secondary | ICD-10-CM | POA: Diagnosis not present

## 2020-03-24 DIAGNOSIS — L821 Other seborrheic keratosis: Secondary | ICD-10-CM | POA: Diagnosis not present

## 2020-03-24 DIAGNOSIS — Z85828 Personal history of other malignant neoplasm of skin: Secondary | ICD-10-CM | POA: Diagnosis not present

## 2020-03-24 DIAGNOSIS — D2272 Melanocytic nevi of left lower limb, including hip: Secondary | ICD-10-CM | POA: Diagnosis not present

## 2020-03-24 DIAGNOSIS — L720 Epidermal cyst: Secondary | ICD-10-CM | POA: Diagnosis not present

## 2020-03-24 DIAGNOSIS — D2261 Melanocytic nevi of right upper limb, including shoulder: Secondary | ICD-10-CM | POA: Diagnosis not present

## 2020-03-24 DIAGNOSIS — L570 Actinic keratosis: Secondary | ICD-10-CM | POA: Diagnosis not present

## 2020-03-28 DIAGNOSIS — Z1231 Encounter for screening mammogram for malignant neoplasm of breast: Secondary | ICD-10-CM | POA: Diagnosis not present

## 2020-04-04 ENCOUNTER — Ambulatory Visit (INDEPENDENT_AMBULATORY_CARE_PROVIDER_SITE_OTHER): Payer: Medicare PPO | Admitting: Otolaryngology

## 2020-04-04 ENCOUNTER — Other Ambulatory Visit: Payer: Self-pay

## 2020-04-04 VITALS — Temp 97.7°F

## 2020-04-04 DIAGNOSIS — K219 Gastro-esophageal reflux disease without esophagitis: Secondary | ICD-10-CM | POA: Diagnosis not present

## 2020-04-04 DIAGNOSIS — R490 Dysphonia: Secondary | ICD-10-CM | POA: Diagnosis not present

## 2020-04-04 DIAGNOSIS — J310 Chronic rhinitis: Secondary | ICD-10-CM | POA: Diagnosis not present

## 2020-04-04 NOTE — Progress Notes (Signed)
HPI: Amber Mccarthy is a 79 y.o. female who presents for evaluation of allergies and voice problems.  She works as a Doctor, general practice.  She has been on several zoom calls using her voice a lot over a couple of hours and has had intermittent hoarseness.  She is doing better today.  She also complains of chronic sensation of mucus in her throat that she has to clear.  She does have history of seasonal allergies. She is concerned that she may have a vocal cord polyp or nodule.  She also complains of chronic phlegm in her throat. However her voice is doing much better today and she has no significant hoarseness on discussion with her today.  Past Medical History:  Diagnosis Date  . Colon polyps   . PAF (paroxysmal atrial fibrillation) ([facility]) 09/2014   a.  Echo 6/16:  Vigorous LVF, EF 65-70%, no RWMA, Gr 1 DD   Past Surgical History:  Procedure Laterality Date  . CESAREAN SECTION  1987   Social History   Socioeconomic History  . Marital status: Married    Spouse name: [name]  . Number of children: 1  . Years of education: Not on file  . Highest education level: Not on file  Occupational History  . Occupation: Doctor, general practice    Comment: Retired from [employer]  . Smoking status: Former Smoker    Packs/day: 1.00    Years: 5.00    Pack years: 5.00    Start date: 1965    Quit date: 1970    Years since quitting: 51.9  . Smokeless tobacco: Never Used  . Tobacco comment: quit 1970s  Substance and Sexual Activity  . Alcohol use: Yes    Alcohol/week: 7.0 standard drinks    Types: 7 Standard drinks or equivalent per week    Comment: 1 glass of wine per night--> since afib she has decreased  . Drug use: No  . Sexual activity: Not on file  Other Topics Concern  . Not on file  Social History Narrative   Pt lives in [place] with spouse.  Retired.  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   Social Determinants of Health    Financial Resource Strain: Not on file  Food Insecurity: Not on file  Transportation Needs: Not on file  Physical Activity: Not on file  Stress: Not on file  Social Connections: Not on file   Family History  Problem Relation Age of Onset  . Cancer Mother        angiosarcoma  . Alzheimer's disease Father    No Known Allergies Prior to Admission medications   Medication Sig Start Date End Date Taking? Authorizing Provider  Calcium Carb-Cholecalciferol (CALCIUM-VITAMIN D) 500-200 MG-UNIT tablet Take 2 tablets by mouth daily.    [provider]  dorzolamide-timolol (COSOPT) 22.3-6.8 MG/ML ophthalmic solution  05/22/19   [provider]  ELIQUIS 5 MG TABS tablet TAKE 1 TABLET (5 MG TOTAL) BY MOUTH 2 (TWO) TIMES DAILY FOR 30 DAYS. 09/07/19   Croitoru, Mihai, MD  latanoprost (XALATAN) 0.005 % ophthalmic solution  04/30/19   [provider]  metoprolol tartrate (LOPRESSOR) 25 MG tablet Take 0.5 tablets (12.5 mg total) by mouth as needed. Take half a tablet as needed for palpitations or elevated heart rate 06/16/18   Dartha Lodge, PA-C  Multiple Vitamin (MULTIVITAMIN) capsule Take 1 capsule by mouth daily. Ultra Women 50+    [provider]  vitamin B-12 (CYANOCOBALAMIN) 1000 MCG tablet Take 1,000 mcg by mouth daily.    [provider]  vitamin C (ASCORBIC ACID) 500 MG tablet Take 500 mg by mouth daily.    [provider]     Positive ROS: Otherwise negative  All other systems have been reviewed and were otherwise negative with the exception of those mentioned in the HPI and as above.  Physical Exam: Constitutional: Alert, well-appearing, no acute distress Ears: External ears without lesions or tenderness. Ear canals are clear bilaterally with intact, clear TMs.  Nasal: External nose without lesions. Septum slightly deviated to the right with mild rhinitis.  Clear mucus discharge.  Both middle meatus regions were clear..  Oral: Lips and  gums without lesions. Tongue and palate mucosa without lesions. Posterior oropharynx clear.  Indirect laryngoscopy revealed a clear base of tongue epiglottis and posterior vocal cords. Fiberoptic laryngoscopy was performed through the left nostril.  The posterior nasal cavity and nasopharynx was clear.  No signs of infection.  The base of tongue vallecula and epiglottis were normal.  The vocal cords were clear bilaterally with normal vocal cord mobility.  No nodules or polyps noted.  Of note she did have mild arytenoid edema consistent with probable mild reflux symptoms but no erythema and no mucosal abnormalities noted. Neck: No palpable adenopathy or masses Respiratory: Breathing comfortably  Skin: No facial/neck lesions or rash noted.  Laryngoscopy  Date/Time: 04/04/2020 11:03 AM Performed by: Drema Halon, MD Authorized by: Drema Halon, MD   Consent:    Consent obtained:  Verbal   Consent given by:  Patient Procedure details:    Indications: hoarseness, dysphagia, or aspiration     Medication:  Afrin   Instrument: flexible fiberoptic laryngoscope     Scope location: left nare   Sinus:    Left middle meatus: normal     Left nasopharynx: normal   Mouth:    Vallecula: normal     Base of tongue: normal     Epiglottis: normal   Throat:    Pyriform sinus: normal     True vocal cords: normal   Comments:     On fiberoptic laryngoscopy vocal cords were clear bilaterally with normal vocal mobility.  No nodules or polyps noted.  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 Mild rhinitis.  Plan: Discussed with her concerning using saline rinses for the nose for excessive mucus. Unfortunately she has glaucoma and cannot use nasal steroid sprays. Also discussed with her concerning possible use  of Prilosec once a day before dinner for 3 to 4 weeks if globus type symptoms are bothersome.  Also discussed with her concerning elevating the head of the bed and not eating within 2 to 3 hours of going to bed.  Narda Bonds, MD

## 2020-04-28 DIAGNOSIS — M545 Low back pain, unspecified: Secondary | ICD-10-CM | POA: Diagnosis not present

## 2020-05-12 DIAGNOSIS — M545 Low back pain, unspecified: Secondary | ICD-10-CM | POA: Diagnosis not present

## 2020-05-22 IMAGING — DX DG KNEE COMPLETE 4+V*R*
4 series · 4 of 4 positions shown · non-contrast
Comparison: None.

CLINICAL DATA: Sudden right knee pain.

EXAM:
RIGHT KNEE - COMPLETE 4+ VIEW

[x knee ap right]
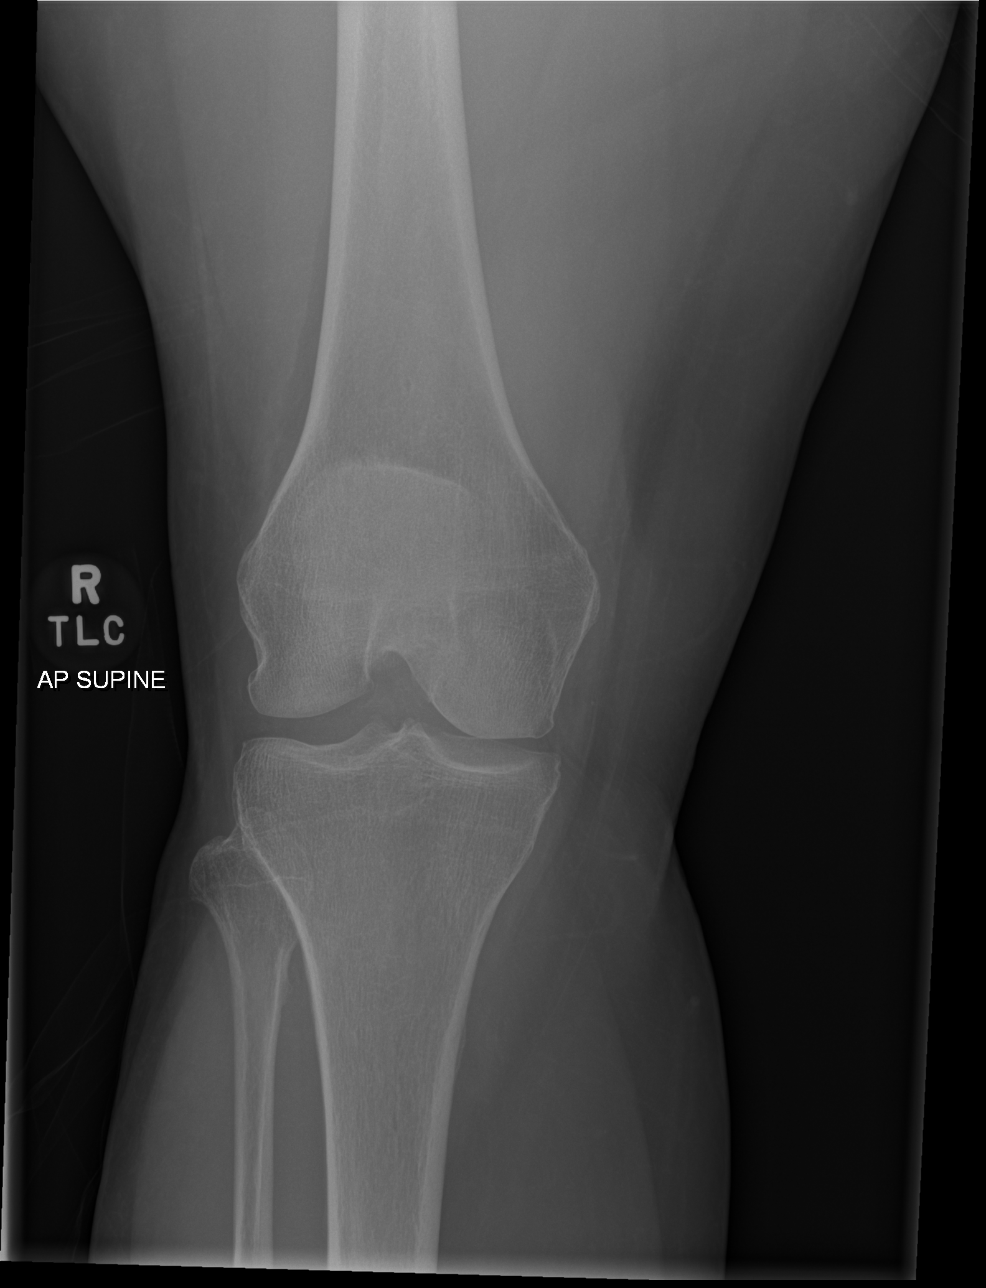

[x knee obl right (1 of 2)]
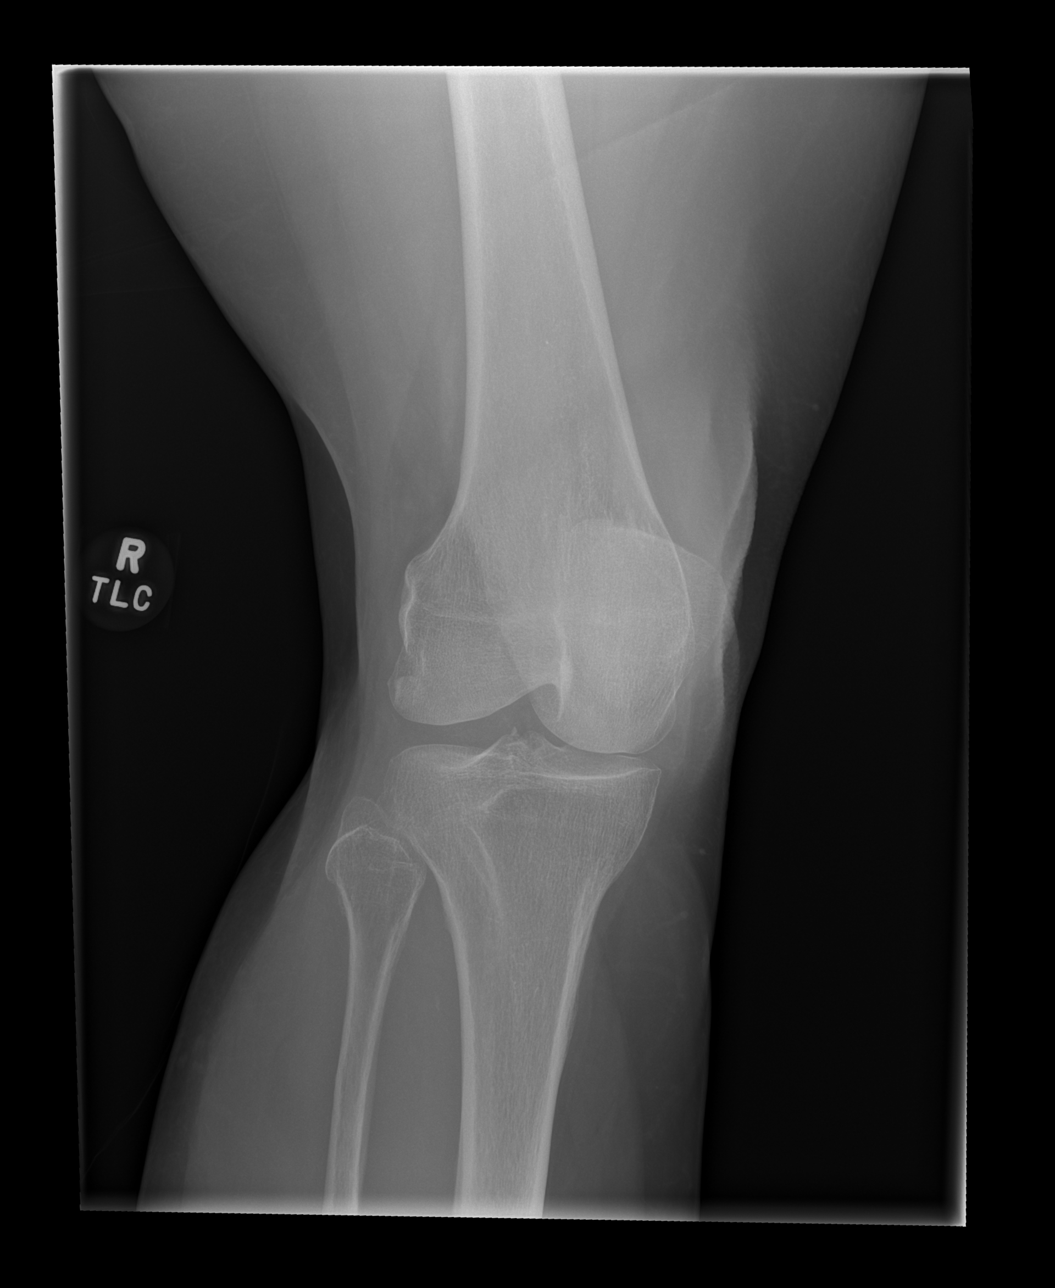

[x knee obl right (2 of 2)]
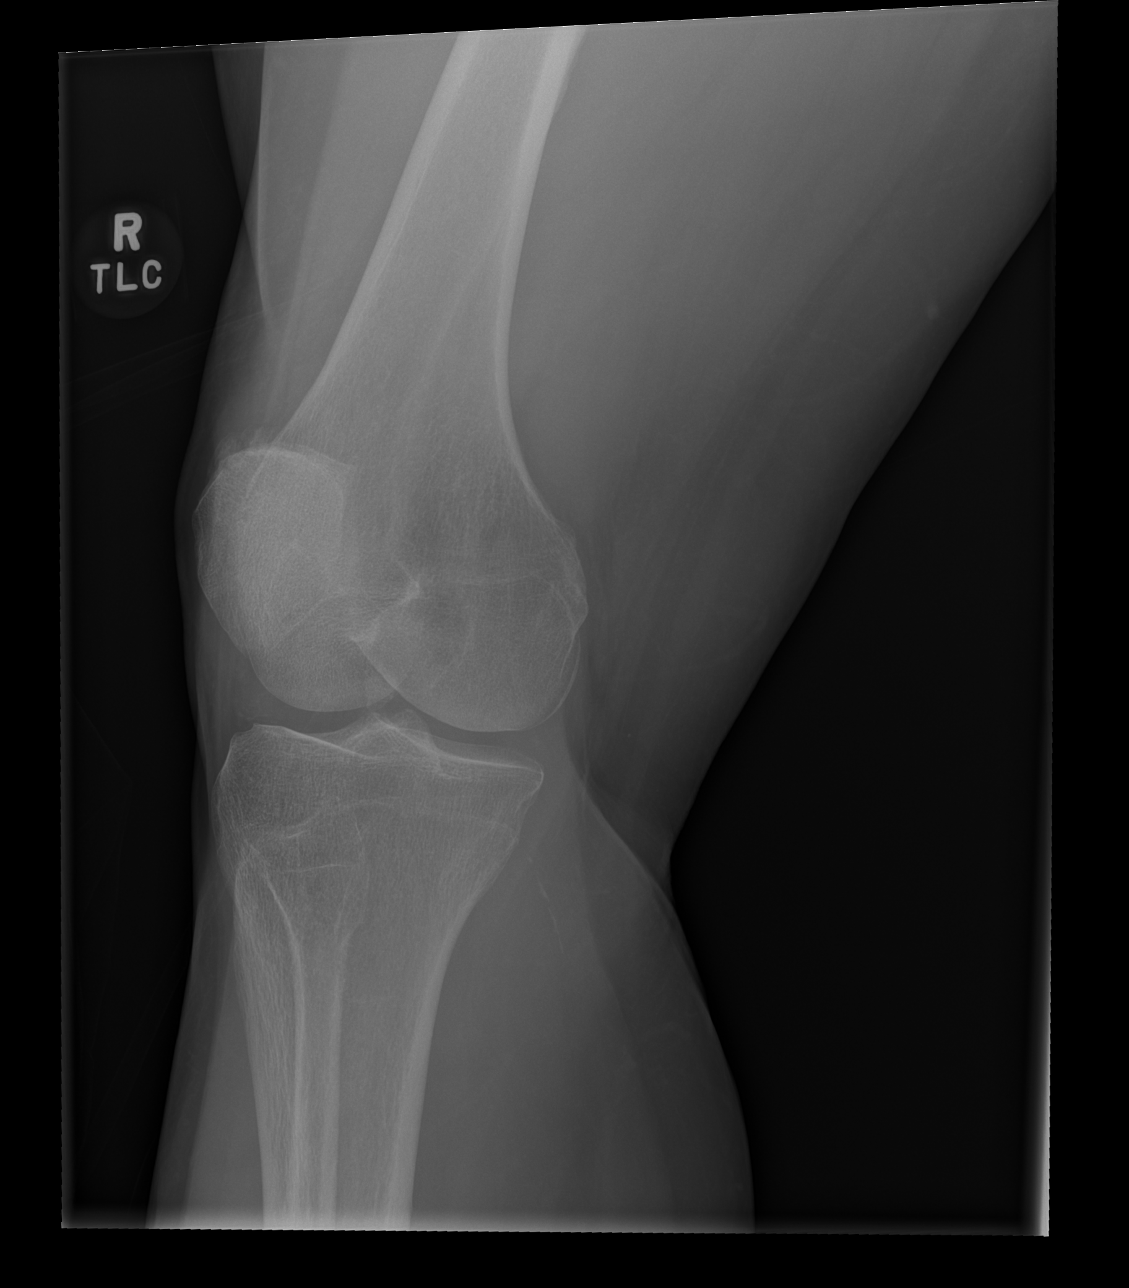

[x knee lat right]
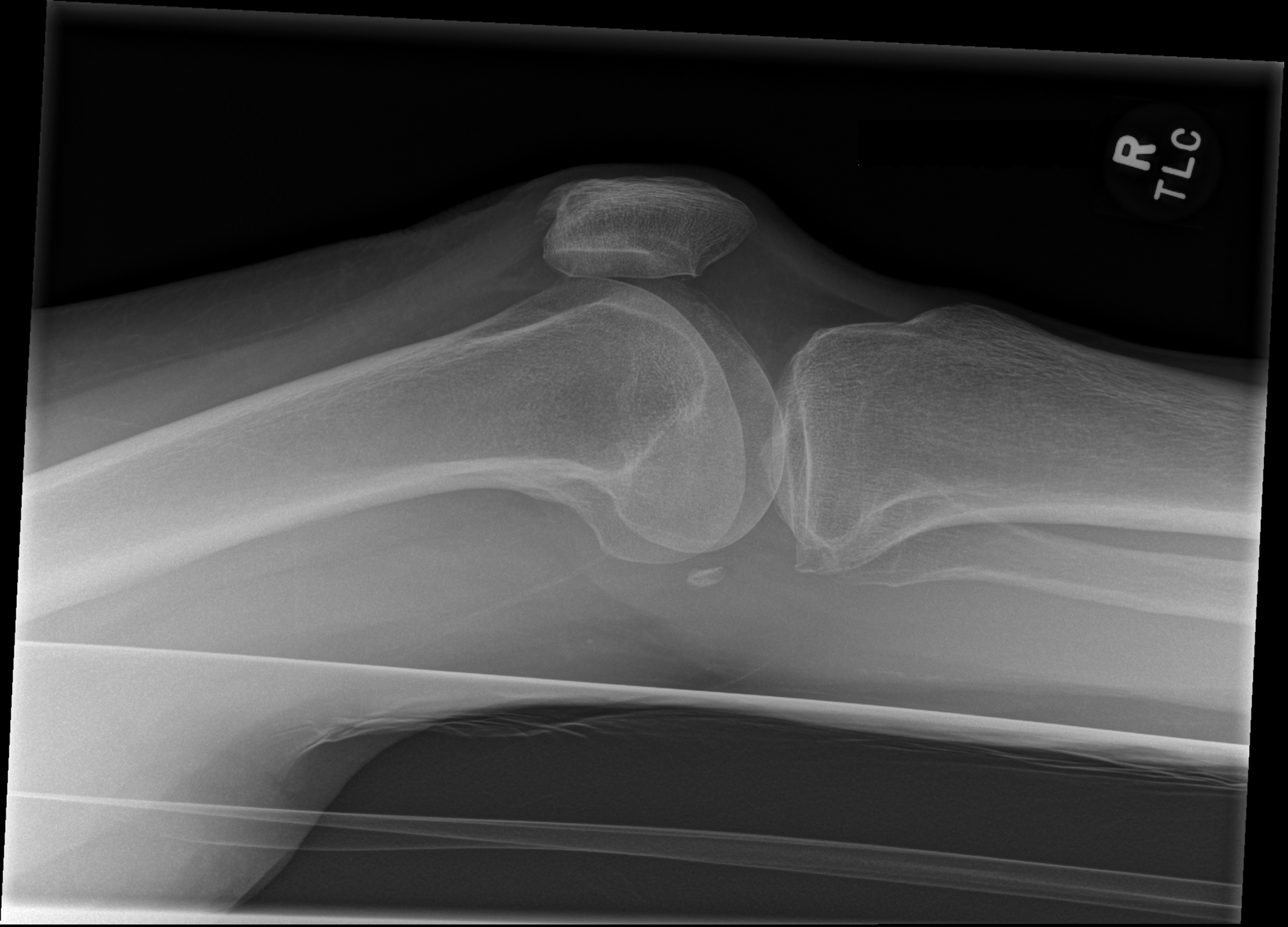

[4 of 4 positions shown; findings below may reference images not displayed]

FINDINGS: No evidence of fracture, dislocation, or joint effusion. Mild
decreased medial femoral tibial joint space is identified. Soft
tissues are unremarkable.
IMPRESSION: No acute fracture or dislocation.

## 2020-05-23 DIAGNOSIS — Z8371 Family history of colonic polyps: Secondary | ICD-10-CM | POA: Diagnosis not present

## 2020-05-23 DIAGNOSIS — Z800 Family history of malignant neoplasm of digestive organs: Secondary | ICD-10-CM | POA: Diagnosis not present

## 2020-05-23 DIAGNOSIS — I480 Paroxysmal atrial fibrillation: Secondary | ICD-10-CM | POA: Diagnosis not present

## 2020-05-23 DIAGNOSIS — Z86010 Personal history of colonic polyps: Secondary | ICD-10-CM | POA: Diagnosis not present

## 2020-05-23 DIAGNOSIS — K52832 Lymphocytic colitis: Secondary | ICD-10-CM | POA: Diagnosis not present

## 2020-05-26 DIAGNOSIS — M545 Low back pain, unspecified: Secondary | ICD-10-CM | POA: Diagnosis not present

## 2020-06-02 ENCOUNTER — Telehealth: Payer: Self-pay | Admitting: Cardiovascular Disease

## 2020-06-02 DIAGNOSIS — M545 Low back pain, unspecified: Secondary | ICD-10-CM | POA: Diagnosis not present

## 2020-06-02 NOTE — Telephone Encounter (Signed)
Compared with most people with AFib, her risk of clot is quite low, even with a 2-week interruption in Eliquis. In fact, if her GI doctor removes polyps, he may ask her to stay off anticoagulants for up to 2 weeks and that would still be pretty safe. No need to delay the dental procedure in my opinion.

## 2020-06-02 NOTE — Telephone Encounter (Signed)
New Message:     Pt would like for Dr [facility] nurse to call her please. She wants to know if Dr C know that she is having 2 procedures and both requires her stop taking her Eliquis. Her concerns is,thatt the procedures are just 6 days apart. The second procedure is dental work and she is supposed to have break through bleeding after it. She know she stops her blood thinner 5 days prior, but should she not stop it afterward, since there will be break through bleeding? Please call her asap, in case she needs to change one of her procedure dates.a

## 2020-06-02 NOTE — Telephone Encounter (Signed)
Spoke with pt who report she is scheduled for two procedures that require her to hold Eliquis and concerned about stopping medication for that long period of time.  March 2- colonoscopy (hold 2 days prior) March 8- Dental procedure (hold 5 days prior)  Pt also report the dental procedure involves gum grafts that can cause breakthrough bleeding and worried about starting Eliquis afterwards. Pt also state dental surgeon mentioned if Dr. Salena Saner feels procedures is too close, then they can change it to March 15.   Pt report she sent MD an email as well explaining everything.

## 2020-06-02 NOTE — Telephone Encounter (Signed)
Pt updated and verbalized understanding.  

## 2020-06-08 ENCOUNTER — Other Ambulatory Visit (INDEPENDENT_AMBULATORY_CARE_PROVIDER_SITE_OTHER): Payer: Self-pay

## 2020-06-08 ENCOUNTER — Telehealth (INDEPENDENT_AMBULATORY_CARE_PROVIDER_SITE_OTHER): Payer: Self-pay

## 2020-06-08 DIAGNOSIS — M545 Low back pain, unspecified: Secondary | ICD-10-CM | POA: Diagnosis not present

## 2020-06-08 MED ORDER — CIPRO HC 0.2-1 % OT SUSP: 3.0000 [drp] | Freq: Two times a day (BID) | OTIC | 0 refills | Status: AC

## 2020-06-16 DIAGNOSIS — H401122 Primary open-angle glaucoma, left eye, moderate stage: Secondary | ICD-10-CM | POA: Diagnosis not present

## 2020-06-17 DIAGNOSIS — Z01812 Encounter for preprocedural laboratory examination: Secondary | ICD-10-CM | POA: Diagnosis not present

## 2020-06-20 ENCOUNTER — Other Ambulatory Visit: Payer: Self-pay

## 2020-06-20 ENCOUNTER — Ambulatory Visit (INDEPENDENT_AMBULATORY_CARE_PROVIDER_SITE_OTHER): Payer: Medicare PPO | Admitting: Otolaryngology

## 2020-06-20 VITALS — Temp 97.3°F

## 2020-06-20 DIAGNOSIS — M545 Low back pain, unspecified: Secondary | ICD-10-CM | POA: Diagnosis not present

## 2020-06-20 DIAGNOSIS — H6122 Impacted cerumen, left ear: Secondary | ICD-10-CM

## 2020-06-20 NOTE — Progress Notes (Signed)
HPI: Amber Mccarthy is a 80 y.o. female who returns today for evaluation of left ear being stopped up for the past 3 weeks.  She has tried eardrops without benefit.  Past Medical History:  Diagnosis Date  . Colon polyps   . PAF (paroxysmal atrial fibrillation) (HCC) 09/2014   a.  Echo 6/16:  Vigorous LVF, EF 65-70%, no RWMA, Gr 1 DD   Past Surgical History:  Procedure Laterality Date  . CESAREAN SECTION  1987   Social History   Socioeconomic History  . Marital status: Married    Spouse name: [name]  . Number of children: 1  . Years of education: Not on file  . Highest education level: Not on file  Occupational History  . Occupation: Doctor, general practice    Comment: Retired from [employer]  . Smoking status: Former Smoker    Packs/day: 1.00    Years: 5.00    Pack years: 5.00    Start date: 1965    Quit date: 1970    Years since quitting: 52.1  . Smokeless tobacco: Never Used  . Tobacco comment: quit 1970s  Substance and Sexual Activity  . Alcohol use: Yes    Alcohol/week: 7.0 standard drinks    Types: 7 Standard drinks or equivalent per week    Comment: 1 glass of wine per night--> since afib she has decreased  . Drug use: No  . Sexual activity: Not on file  Other Topics Concern  . Not on file  Social History Narrative   Pt lives in [place] with spouse.  Retired.  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   Social Determinants of Health   Financial Resource Strain: Not on file  Food Insecurity: Not on file  Transportation Needs: Not on file  Physical Activity: Not on file  Stress: Not on file  Social Connections: Not on file   Family History  Problem Relation Age of Onset  . Cancer Mother        angiosarcoma  . Alzheimer's disease Father    No Known Allergies Prior to Admission medications   Medication Sig Start Date End Date Taking? Authorizing Provider  Calcium Carb-Cholecalciferol (CALCIUM-VITAMIN  D) 500-200 MG-UNIT tablet Take 2 tablets by mouth daily.    [provider]  dorzolamide-timolol (COSOPT) 22.3-6.8 MG/ML ophthalmic solution  05/22/19   [provider]  ELIQUIS 5 MG TABS tablet TAKE 1 TABLET (5 MG TOTAL) BY MOUTH 2 (TWO) TIMES DAILY FOR 30 DAYS. 09/07/19   Croitoru, Mihai, MD  latanoprost (XALATAN) 0.005 % ophthalmic solution  04/30/19   [provider]  metoprolol tartrate (LOPRESSOR) 25 MG tablet Take 0.5 tablets (12.5 mg total) by mouth as needed. Take half a tablet as needed for palpitations or elevated heart rate 06/16/18   Dartha Lodge, PA-C  Multiple Vitamin (MULTIVITAMIN) capsule Take 1 capsule by mouth daily. Ultra Women 50+    [provider]  vitamin B-12 (CYANOCOBALAMIN) 1000 MCG tablet Take 1,000 mcg by mouth daily.    [provider]  vitamin C (ASCORBIC ACID) 500 MG tablet Take 500 mg by mouth daily.    [provider]     Positive ROS: Otherwise negative  All other systems have been reviewed and were otherwise negative with the exception of those mentioned in the HPI and as above.  Physical Exam: Constitutional: Alert, well-appearing, no acute distress Ears: External ears without lesions or tenderness.  Right ear  canal right TM are clear.  Left TM reveals some debris adjacent to the left TM that was cleaned with hydrogen peroxide and suction as well as alcohol and suction.  No real evidence of external otitis although she had slight moisture within the ear canal.  No swelling.  The TM was intact and clear otherwise.  After cleaning the ear canal the ear felt much better. Nasal: External nose without lesions.. Clear nasal passages Oral: Lips and gums without lesions. Tongue and palate mucosa without lesions. Posterior oropharynx clear. Neck: No palpable adenopathy or masses Respiratory: Breathing comfortably  Skin: No facial/neck lesions or rash noted.  Cerumen impaction removal  Date/Time: 06/20/2020 6:28  PM Performed by: Drema Halon, MD Authorized by: Drema Halon, MD   Consent:    Consent obtained:  Verbal   Consent given by:  Patient   Risks discussed:  Pain and bleeding Procedure details:    Location:  L ear   Procedure type: suction   Post-procedure details:    Inspection:  TM intact and canal normal   Hearing quality:  Improved   Patient tolerance of procedure:  Tolerated well, no immediate complications Comments:     Patient had perhaps mild inflammatory changes but mostly just debris adjacent to the left TM that was cleaned with suction.    Assessment: Left ear obstruction secondary to debris in the left ear canal adjacent to the left TM.  Plan: This was cleaned in the office.  She will follow-up as needed.   Narda Bonds, MD

## 2020-06-22 DIAGNOSIS — Z8371 Family history of colonic polyps: Secondary | ICD-10-CM | POA: Diagnosis not present

## 2020-06-22 DIAGNOSIS — K644 Residual hemorrhoidal skin tags: Secondary | ICD-10-CM | POA: Diagnosis not present

## 2020-06-22 DIAGNOSIS — D120 Benign neoplasm of cecum: Secondary | ICD-10-CM | POA: Diagnosis not present

## 2020-06-22 DIAGNOSIS — K5730 Diverticulosis of large intestine without perforation or abscess without bleeding: Secondary | ICD-10-CM | POA: Diagnosis not present

## 2020-06-22 DIAGNOSIS — Z86010 Personal history of colonic polyps: Secondary | ICD-10-CM | POA: Diagnosis not present

## 2020-06-22 DIAGNOSIS — K648 Other hemorrhoids: Secondary | ICD-10-CM | POA: Diagnosis not present

## 2020-06-22 DIAGNOSIS — Z800 Family history of malignant neoplasm of digestive organs: Secondary | ICD-10-CM | POA: Diagnosis not present

## 2020-06-27 DIAGNOSIS — D120 Benign neoplasm of cecum: Secondary | ICD-10-CM | POA: Diagnosis not present

## 2020-07-05 ENCOUNTER — Ambulatory Visit (INDEPENDENT_AMBULATORY_CARE_PROVIDER_SITE_OTHER): Payer: Medicare PPO | Admitting: Otolaryngology

## 2020-07-05 ENCOUNTER — Other Ambulatory Visit: Payer: Self-pay

## 2020-07-05 VITALS — Temp 97.3°F

## 2020-07-05 DIAGNOSIS — H60312 Diffuse otitis externa, left ear: Secondary | ICD-10-CM | POA: Diagnosis not present

## 2020-07-05 DIAGNOSIS — H6123 Impacted cerumen, bilateral: Secondary | ICD-10-CM

## 2020-07-05 NOTE — Progress Notes (Signed)
HPI: Amber Mccarthy is a 80 y.o. female who returns today for evaluation of left ear being clogged.  She was last seen on 06/20/2020 with a left fungal external otitis and applied gentian violet and CSF powder to the left ear.  She is continued have some itching and blockage of the right ear.  She returns today because of blocked left ear..  Past Medical History:  Diagnosis Date  . Colon polyps   . PAF (paroxysmal atrial fibrillation) (HCC) 09/2014   a.  Echo 6/16:  Vigorous LVF, EF 65-70%, no RWMA, Gr 1 DD   Past Surgical History:  Procedure Laterality Date  . CESAREAN SECTION  1987   Social History   Socioeconomic History  . Marital status: Married    Spouse name: [name]  . Number of children: 1  . Years of education: Not on file  . Highest education level: Not on file  Occupational History  . Occupation: Doctor, general practice    Comment: Retired from [employer]  . Smoking status: Former Smoker    Packs/day: 1.00    Years: 5.00    Pack years: 5.00    Start date: 1965    Quit date: 1970    Years since quitting: 52.2  . Smokeless tobacco: Never Used  . Tobacco comment: quit 1970s  Substance and Sexual Activity  . Alcohol use: Yes    Alcohol/week: 7.0 standard drinks    Types: 7 Standard drinks or equivalent per week    Comment: 1 glass of wine per night--> since afib she has decreased  . Drug use: No  . Sexual activity: Not on file  Other Topics Concern  . Not on file  Social History Narrative   Pt lives in [place] with spouse.  Retired.  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   Social Determinants of Health   Financial Resource Strain: Not on file  Food Insecurity: Not on file  Transportation Needs: Not on file  Physical Activity: Not on file  Stress: Not on file  Social Connections: Not on file   Family History  Problem Relation Age of Onset  . Cancer Mother        angiosarcoma  . Alzheimer's disease  Father    No Known Allergies Prior to Admission medications   Medication Sig Start Date End Date Taking? Authorizing Provider  Calcium Carb-Cholecalciferol (CALCIUM-VITAMIN D) 500-200 MG-UNIT tablet Take 2 tablets by mouth daily.    [provider]  dorzolamide-timolol (COSOPT) 22.3-6.8 MG/ML ophthalmic solution  05/22/19   [provider]  ELIQUIS 5 MG TABS tablet TAKE 1 TABLET (5 MG TOTAL) BY MOUTH 2 (TWO) TIMES DAILY FOR 30 DAYS. 09/07/19   Croitoru, Mihai, MD  latanoprost (XALATAN) 0.005 % ophthalmic solution  04/30/19   [provider]  metoprolol tartrate (LOPRESSOR) 25 MG tablet Take 0.5 tablets (12.5 mg total) by mouth as needed. Take half a tablet as needed for palpitations or elevated heart rate 06/16/18   Dartha Lodge, PA-C  Multiple Vitamin (MULTIVITAMIN) capsule Take 1 capsule by mouth daily. Ultra Women 50+    [provider]  vitamin B-12 (CYANOCOBALAMIN) 1000 MCG tablet Take 1,000 mcg by mouth daily.    [provider]  vitamin C (ASCORBIC ACID) 500 MG tablet Take 500 mg by mouth daily.    [provider]     Positive ROS: Otherwise negative  All other systems have been reviewed and were  otherwise negative with the exception of those mentioned in the HPI and as above.  Physical Exam: Constitutional: Alert, well-appearing, no acute distress Ears: External ears without lesions or tenderness.  Right ear canal revealed minimal wax buildup that was removed adjacent to the TM.  The right TM was otherwise clear in the ear canal was clear.  Left ear canal reveals fungal external otitis.  The ear canal was copiously cleaned with hydroperoxide suction and alcohol and suction.  After cleaning the ear canal I applied gentian violet, Ciprodex and CSF powder to the left ear. Nasal: External nose without lesions.. Clear nasal passages Oral: Lips and gums without lesions. Tongue and palate mucosa without lesions. Posterior oropharynx  clear. Neck: No palpable adenopathy or masses Respiratory: Breathing comfortably  Skin: No facial/neck lesions or rash noted.  Cerumen impaction removal  Date/Time: 07/05/2020 3:52 PM Performed by: Drema Halon, MD Authorized by: Drema Halon, MD   Consent:    Consent obtained:  Verbal   Consent given by:  Patient   Risks discussed:  Pain and bleeding Procedure details:    Location:  L ear and R ear   Procedure type: suction and forceps   Post-procedure details:    Inspection:  TM intact and canal normal   Hearing quality:  Improved   Patient tolerance of procedure:  Tolerated well, no immediate complications Comments:     Right ear canal had minimal wax adjacent to the right TM that was removed with forceps.  Left ear canal with fungal external otitis after cleaning the ear canal I applied gentian violet, Ciprodex and CSF powder to the left ear.    Assessment: Persistent left ear fungal external otitis.  Plan: Recommend keeping the ear dry and will have her follow-up in 10 days for recheck and cleaning the ear. I did give her a prescription for Cortisporin drops to use 4 drops twice daily as needed any itching.   Narda Bonds, MD

## 2020-07-06 ENCOUNTER — Ambulatory Visit (INDEPENDENT_AMBULATORY_CARE_PROVIDER_SITE_OTHER): Payer: Medicare PPO | Admitting: Otolaryngology

## 2020-07-13 ENCOUNTER — Encounter (INDEPENDENT_AMBULATORY_CARE_PROVIDER_SITE_OTHER): Payer: Self-pay | Admitting: Otolaryngology

## 2020-07-13 ENCOUNTER — Other Ambulatory Visit: Payer: Self-pay

## 2020-07-13 ENCOUNTER — Ambulatory Visit (INDEPENDENT_AMBULATORY_CARE_PROVIDER_SITE_OTHER): Payer: Medicare PPO | Admitting: Otolaryngology

## 2020-07-13 VITALS — Temp 97.2°F

## 2020-07-13 DIAGNOSIS — H60312 Diffuse otitis externa, left ear: Secondary | ICD-10-CM | POA: Diagnosis not present

## 2020-07-13 NOTE — Progress Notes (Signed)
HPI: Amber Mccarthy is a 80 y.o. female who returns today for evaluation of left ear fungal external otitis.  It is doing better but still feels a little blocked.  She has had no drainage from the ear. She previously worked as a Doctor, general practice at [employer]..  Past Medical History:  Diagnosis Date  . Colon polyps   . PAF (paroxysmal atrial fibrillation) (HCC) 09/2014   a.  Echo 6/16:  Vigorous LVF, EF 65-70%, no RWMA, Gr 1 DD   Past Surgical History:  Procedure Laterality Date  . CESAREAN SECTION  1987   Social History   Socioeconomic History  . Marital status: Married    Spouse name: [name]  . Number of children: 1  . Years of education: Not on file  . Highest education level: Not on file  Occupational History  . Occupation: Doctor, general practice    Comment: Retired from [employer]  . Smoking status: Former Smoker    Packs/day: 1.00    Years: 5.00    Pack years: 5.00    Start date: 1965    Quit date: 1970    Years since quitting: 52.2  . Smokeless tobacco: Never Used  . Tobacco comment: quit 1970s  Substance and Sexual Activity  . Alcohol use: Yes    Alcohol/week: 7.0 standard drinks    Types: 7 Standard drinks or equivalent per week    Comment: 1 glass of wine per night--> since afib she has decreased  . Drug use: No  . Sexual activity: Not on file  Other Topics Concern  . Not on file  Social History Narrative   Pt lives in [place] with spouse.  Retired.  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   Social Determinants of Health   Financial Resource Strain: Not on file  Food Insecurity: Not on file  Transportation Needs: Not on file  Physical Activity: Not on file  Stress: Not on file  Social Connections: Not on file   Family History  Problem Relation Age of Onset  . Cancer Mother        angiosarcoma  . Alzheimer's disease Father    No Known Allergies Prior to Admission medications   Medication Sig  Start Date End Date Taking? Authorizing Provider  Calcium Carb-Cholecalciferol (CALCIUM-VITAMIN D) 500-200 MG-UNIT tablet Take 2 tablets by mouth daily.    [provider]  dorzolamide-timolol (COSOPT) 22.3-6.8 MG/ML ophthalmic solution  05/22/19   [provider]  ELIQUIS 5 MG TABS tablet TAKE 1 TABLET (5 MG TOTAL) BY MOUTH 2 (TWO) TIMES DAILY FOR 30 DAYS. 09/07/19   Croitoru, Mihai, MD  latanoprost (XALATAN) 0.005 % ophthalmic solution  04/30/19   [provider]  metoprolol tartrate (LOPRESSOR) 25 MG tablet Take 0.5 tablets (12.5 mg total) by mouth as needed. Take half a tablet as needed for palpitations or elevated heart rate 06/16/18   Dartha Lodge, PA-C  Multiple Vitamin (MULTIVITAMIN) capsule Take 1 capsule by mouth daily. Ultra Women 50+    [provider]  vitamin B-12 (CYANOCOBALAMIN) 1000 MCG tablet Take 1,000 mcg by mouth daily.    [provider]  vitamin C (ASCORBIC ACID) 500 MG tablet Take 500 mg by mouth daily.    [provider]     Positive ROS: Otherwise negative  All other systems have been reviewed and were otherwise negative with the exception of those mentioned in the HPI and as above.  Physical  Exam: Constitutional: Alert, well-appearing, no acute distress Ears: External ears without lesions or tenderness.  Left ear canal with a small amount of fungal element on the TM inferiorly.  But this looks much better.  The remaining ear canal is clear.  Ear canal was cleaned again with suction and then reapplied gentian violet Ciprodex and CSF powder to the left ear only.  On hearing screening AC was greater than BC although she has slightly diminished hearing in both ears. Nasal: External nose without lesions.Clear nasal passages Oral: Lips and gums without lesions. Tongue and palate mucosa without lesions. Posterior oropharynx clear. Neck: No palpable adenopathy or masses Respiratory: Breathing comfortably  Skin: No  facial/neck lesions or rash noted.  Procedures  Assessment: Left fungal external otitis.  Plan: This was cleaned again and reapplied gentian violet and CSF powder to the left ear.  She will follow-up in 1 week for recheck.  Recommended keeping the ear dry.   Narda Bonds, MD

## 2020-07-18 DIAGNOSIS — M545 Low back pain, unspecified: Secondary | ICD-10-CM | POA: Diagnosis not present

## 2020-07-20 ENCOUNTER — Other Ambulatory Visit: Payer: Self-pay

## 2020-07-20 ENCOUNTER — Ambulatory Visit (INDEPENDENT_AMBULATORY_CARE_PROVIDER_SITE_OTHER): Payer: Medicare PPO | Admitting: Otolaryngology

## 2020-07-20 DIAGNOSIS — H60312 Diffuse otitis externa, left ear: Secondary | ICD-10-CM

## 2020-07-20 NOTE — Progress Notes (Signed)
HPI: Amber Mccarthy is a 80 y.o. female who returns today for evaluation of chronic left fungal external otitis.  She is doing much better today with no symptoms and the ear feels good.  She has had no drainage.  She has kept water out of the ear and uses a hair dryer when her ear gets wet.  Past Medical History:  Diagnosis Date  . Colon polyps   . PAF (paroxysmal atrial fibrillation) (HCC) 09/2014   a.  Echo 6/16:  Vigorous LVF, EF 65-70%, no RWMA, Gr 1 DD   Past Surgical History:  Procedure Laterality Date  . CESAREAN SECTION  1987   Social History   Socioeconomic History  . Marital status: Married    Spouse name: [name]  . Number of children: 1  . Years of education: Not on file  . Highest education level: Not on file  Occupational History  . Occupation: Doctor, general practice    Comment: Retired from [employer]  . Smoking status: Former Smoker    Packs/day: 1.00    Years: 5.00    Pack years: 5.00    Start date: 1965    Quit date: 1970    Years since quitting: 52.2  . Smokeless tobacco: Never Used  . Tobacco comment: quit 1970s  Substance and Sexual Activity  . Alcohol use: Yes    Alcohol/week: 7.0 standard drinks    Types: 7 Standard drinks or equivalent per week    Comment: 1 glass of wine per night--> since afib she has decreased  . Drug use: No  . Sexual activity: Not on file  Other Topics Concern  . Not on file  Social History Narrative   Pt lives in [place] with spouse.  Retired.  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   Social Determinants of Health   Financial Resource Strain: Not on file  Food Insecurity: Not on file  Transportation Needs: Not on file  Physical Activity: Not on file  Stress: Not on file  Social Connections: Not on file   Family History  Problem Relation Age of Onset  . Cancer Mother        angiosarcoma  . Alzheimer's disease Father    No Known Allergies Prior to Admission  medications   Medication Sig Start Date End Date Taking? Authorizing Provider  Calcium Carb-Cholecalciferol (CALCIUM-VITAMIN D) 500-200 MG-UNIT tablet Take 2 tablets by mouth daily.    [provider]  dorzolamide-timolol (COSOPT) 22.3-6.8 MG/ML ophthalmic solution  05/22/19   [provider]  ELIQUIS 5 MG TABS tablet TAKE 1 TABLET (5 MG TOTAL) BY MOUTH 2 (TWO) TIMES DAILY FOR 30 DAYS. 09/07/19   Croitoru, Mihai, MD  latanoprost (XALATAN) 0.005 % ophthalmic solution  04/30/19   [provider]  metoprolol tartrate (LOPRESSOR) 25 MG tablet Take 0.5 tablets (12.5 mg total) by mouth as needed. Take half a tablet as needed for palpitations or elevated heart rate 06/16/18   Dartha Lodge, PA-C  Multiple Vitamin (MULTIVITAMIN) capsule Take 1 capsule by mouth daily. Ultra Women 50+    [provider]  vitamin B-12 (CYANOCOBALAMIN) 1000 MCG tablet Take 1,000 mcg by mouth daily.    [provider]  vitamin C (ASCORBIC ACID) 500 MG tablet Take 500 mg by mouth daily.    [provider]     Positive ROS: Otherwise negative  All other systems have been reviewed and were otherwise negative with the exception  of those mentioned in the HPI and as above.  Physical Exam: Constitutional: Alert, well-appearing, no acute distress Ears: External ears without lesions or tenderness.  Right ear canal is normal.  On microscopic exam of the left ear canal she has gentian violet within the ear canal.  She has 1 small 1 mm area of fungal debris that was removed with #5 suction.  Following this I applied gentian violet that one small area along with CSF powder. Nasal: External nose without lesions. . Clear nasal passages Oral: Lips and gums without lesions. Tongue and palate mucosa without lesions. Posterior oropharynx clear. Neck: No palpable adenopathy or masses Respiratory: Breathing comfortably  Skin: No facial/neck lesions or rash  noted.  Procedures  Assessment: Chronic left ear fungal external otitis  Plan: This is doing much better today with minimal residual disease that was treated in the office with gentian violet and CSF powder. Recommended keeping the ears dry as possible will follow up in 1 month for recheck.   Narda Bonds, MD

## 2020-08-15 DIAGNOSIS — M545 Low back pain, unspecified: Secondary | ICD-10-CM | POA: Diagnosis not present

## 2020-08-16 ENCOUNTER — Other Ambulatory Visit: Payer: Self-pay

## 2020-08-16 ENCOUNTER — Encounter (INDEPENDENT_AMBULATORY_CARE_PROVIDER_SITE_OTHER): Payer: Self-pay | Admitting: Otolaryngology

## 2020-08-16 ENCOUNTER — Ambulatory Visit (INDEPENDENT_AMBULATORY_CARE_PROVIDER_SITE_OTHER): Payer: Medicare PPO | Admitting: Otolaryngology

## 2020-08-16 VITALS — Temp 96.6°F

## 2020-08-16 DIAGNOSIS — H6122 Impacted cerumen, left ear: Secondary | ICD-10-CM | POA: Diagnosis not present

## 2020-08-16 DIAGNOSIS — H60312 Diffuse otitis externa, left ear: Secondary | ICD-10-CM

## 2020-08-16 NOTE — Progress Notes (Signed)
HPI: Amber Mccarthy is a 80 y.o. female who returns today for evaluation of chronic left external otitis.  She was last seen about 4 weeks ago.  She did well for 3 weeks but then started having some blockage and drainage over the past week..  Past Medical History:  Diagnosis Date  . Colon polyps   . PAF (paroxysmal atrial fibrillation) (HCC) 09/2014   a.  Echo 6/16:  Vigorous LVF, EF 65-70%, no RWMA, Gr 1 DD   Past Surgical History:  Procedure Laterality Date  . CESAREAN SECTION  1987   Social History   Socioeconomic History  . Marital status: Married    Spouse name: [name]  . Number of children: 1  . Years of education: Not on file  . Highest education level: Not on file  Occupational History  . Occupation: Doctor, general practice    Comment: Retired from [employer]  . Smoking status: Former Smoker    Packs/day: 1.00    Years: 5.00    Pack years: 5.00    Start date: 1965    Quit date: 1970    Years since quitting: 52.3  . Smokeless tobacco: Never Used  . Tobacco comment: quit 1970s  Substance and Sexual Activity  . Alcohol use: Yes    Alcohol/week: 7.0 standard drinks    Types: 7 Standard drinks or equivalent per week    Comment: 1 glass of wine per night--> since afib she has decreased  . Drug use: No  . Sexual activity: Not on file  Other Topics Concern  . Not on file  Social History Narrative   Pt lives in [place] with spouse.  Retired.  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   Social Determinants of Health   Financial Resource Strain: Not on file  Food Insecurity: Not on file  Transportation Needs: Not on file  Physical Activity: Not on file  Stress: Not on file  Social Connections: Not on file   Family History  Problem Relation Age of Onset  . Cancer Mother        angiosarcoma  . Alzheimer's disease Father    No Known Allergies Prior to Admission medications   Medication Sig Start Date End Date  Taking? Authorizing Provider  Calcium Carb-Cholecalciferol (CALCIUM-VITAMIN D) 500-200 MG-UNIT tablet Take 2 tablets by mouth daily.    [provider]  dorzolamide-timolol (COSOPT) 22.3-6.8 MG/ML ophthalmic solution  05/22/19   [provider]  ELIQUIS 5 MG TABS tablet TAKE 1 TABLET (5 MG TOTAL) BY MOUTH 2 (TWO) TIMES DAILY FOR 30 DAYS. 09/07/19   Croitoru, Mihai, MD  latanoprost (XALATAN) 0.005 % ophthalmic solution  04/30/19   [provider]  metoprolol tartrate (LOPRESSOR) 25 MG tablet Take 0.5 tablets (12.5 mg total) by mouth as needed. Take half a tablet as needed for palpitations or elevated heart rate 06/16/18   Dartha Lodge, PA-C  Multiple Vitamin (MULTIVITAMIN) capsule Take 1 capsule by mouth daily. Ultra Women 50+    [provider]  vitamin B-12 (CYANOCOBALAMIN) 1000 MCG tablet Take 1,000 mcg by mouth daily.    [provider]  vitamin C (ASCORBIC ACID) 500 MG tablet Take 500 mg by mouth daily.    [provider]     Positive ROS: Otherwise negative  All other systems have been reviewed and were otherwise negative with the exception of those mentioned in the HPI and as above.  Physical Exam: Constitutional:  Alert, well-appearing, no acute distress Ears: External ears without lesions or tenderness.  Right ear canal and right TM are clear.  Left ear canal has gentian violet as well as debris that was cleaned with suction.  I copiously irrigated the ear with hydroperoxide as well as alcohol.  She had small area of granulation tissue on the TM.  This was cleaned with irrigation and suction.  After cleaning the ear I reapplied gentian violet and CSF powder to the left ear. Nasal: External nose without lesions. Clear nasal passages Oral: Lips and gums without lesions. Tongue and palate mucosa without lesions. Posterior oropharynx clear. Neck: No palpable adenopathy or masses Respiratory: Breathing comfortably  Skin: No facial/neck  lesions or rash noted.  Cerumen impaction removal  Date/Time: 08/16/2020 1:54 PM Performed by: Drema Halon, MD Authorized by: Drema Halon, MD   Consent:    Consent obtained:  Verbal   Consent given by:  Patient   Risks discussed:  Pain and bleeding Procedure details:    Location:  L ear   Procedure type: suction   Post-procedure details:    Inspection:  TM intact and canal normal   Hearing quality:  Improved   Patient tolerance of procedure:  Tolerated well, no immediate complications Comments:     Ear canal was completely occluded and was cleaned with suction and irrigation with hydroperoxide as well as alcohol.  She had external otitis questionable fungal external otitis.  This was cleaned and then I applied gentian violet, Ciprodex and CSF powder to the left ear only.    Assessment: Chronic left external otitis with obstruction.  Plan: This was cleaned in the office and after cleaning the ear canal I applied gentian violet, Ciprodex and CSF powder. Recommended keeping it dry and will follow up in 7 to 10 days for recheck.   Narda Bonds, MD

## 2020-08-17 DIAGNOSIS — M545 Low back pain, unspecified: Secondary | ICD-10-CM | POA: Diagnosis not present

## 2020-08-19 ENCOUNTER — Encounter: Payer: Self-pay | Admitting: Cardiovascular Disease

## 2020-08-19 ENCOUNTER — Ambulatory Visit: Payer: Medicare PPO | Admitting: Cardiovascular Disease

## 2020-08-19 ENCOUNTER — Other Ambulatory Visit: Payer: Self-pay

## 2020-08-19 VITALS — BP 112/80 | HR 75 | Ht 64.0 in | Wt 142.4 lb

## 2020-08-19 DIAGNOSIS — I480 Paroxysmal atrial fibrillation: Secondary | ICD-10-CM | POA: Diagnosis not present

## 2020-08-19 MED ORDER — METOPROLOL TARTRATE 25 MG PO TABS: 12.5000 mg | ORAL_TABLET | ORAL | 0 refills | Status: DC | PRN

## 2020-08-19 NOTE — Progress Notes (Signed)
Cardiology Office Note:    Date:  08/19/2020   ID:  Amber Mccarthy, DOB 08-15-1940, MRN [id number]  PCP:  Shon Hale, MD  Cardiologist:  Thurmon Fair, MD    Referring MD: Shon Hale, *   Chief complaint: atrial fibrillation  History of Present Illness:    Amber Mccarthy is a 80 y.o. female with a hx of paroxysmal atrial fibrillation, without clinically evident recurrence in years, until a recent episode that led to emergency room visit on June 16, 2018.  Since the arrhythmia had just started, she underwent direct-current cardioversion in the emergency room and has maintained sinus rhythm since then.  It is not clear why, but she was hypokalemic at the time.  For a few weeks following that she had relatively frequent episodes of brief palpitations that she was able to abort with rest and as needed metoprolol.  She has very infrequent palpitations.  She maintains a very active lifestyle and walks 2 miles a day 7 days a week as well as performing yoga classes, without any complaints of chest discomfort, dyspnea or palpitations.  She will have occasional episodes of palpitations for which she takes a very tiny dose of metoprolol.  She is only taken that 2 or 3 times in the last year.  Her biggest complaint is of leg heaviness when she walks, but the complaint gradually improves as she walks father and is less prominent during her second mile compared with the first mile when she walks.  She had a normal lower extremity arterial Doppler study with normal ABIs in September 2021. 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  She is improving with physical therapy.  She has not had any falls, injuries or bleeding problems.  She is compliant with anticoagulation.  She has not had any focal neurological events that could suggest TIA or  CVA.  Her only risk factors for embolic events are age and gender. She does not have vascular disease, hypertension diabetes, history of heart failure or stroke/TIA. Echo in 2016 showed normal left ventricular ejection fraction with EF 65-70% in the left atrium was described as normal in size.   She is a Doctor, general practice, retired head of the speech center at [employer]  Past Medical History:  Diagnosis Date  . Colon polyps   . PAF (paroxysmal atrial fibrillation) (HCC) 09/2014   a.  Echo 6/16:  Vigorous LVF, EF 65-70%, no RWMA, Gr 1 DD    Past Surgical History:  Procedure Laterality Date  . CESAREAN SECTION  1987    Current Medications: Current Meds  Medication Sig  . Calcium Carb-Cholecalciferol (CALCIUM-VITAMIN D) 500-200 MG-UNIT tablet Take 2 tablets by mouth daily.  . dorzolamide-timolol (COSOPT) 22.3-6.8 MG/ML ophthalmic solution   . ELIQUIS 5 MG TABS tablet TAKE 1 TABLET (5 MG TOTAL) BY MOUTH 2 (TWO) TIMES DAILY FOR 30 DAYS.  Marland Kitchen latanoprost (XALATAN) 0.005 % ophthalmic solution   . vitamin B-12 (CYANOCOBALAMIN) 1000 MCG tablet Take 1,000 mcg by mouth daily.  . [DISCONTINUED] metoprolol tartrate (LOPRESSOR) 25 MG tablet Take 0.5 tablets (12.5 mg total) by mouth as needed. Take half a tablet as needed for palpitations or elevated heart rate     Allergies:   Patient has no known allergies.   Social History   Socioeconomic History  . Marital status: Married    Spouse name: [name]  .  Number of children: 1  . Years of education: Not on file  . Highest education level: Not on file  Occupational History  . Occupation: Doctor, general practice    Comment: Retired from [employer]  . Smoking status: Former Smoker    Packs/day: 1.00    Years: 5.00    Pack years: 5.00    Start date: 1965    Quit date: 1970    Years since quitting: 52.3  . Smokeless tobacco: Never Used  . Tobacco comment: quit 1970s  Substance and Sexual Activity  . Alcohol use: Yes    Alcohol/week:  7.0 standard drinks    Types: 7 Standard drinks or equivalent per week    Comment: 1 glass of wine per night--> since afib she has decreased  . Drug use: No  . Sexual activity: Not on file  Other Topics Concern  . Not on file  Social History Narrative   Pt lives in [place] with spouse.  Retired.  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   Social Determinants of Health   Financial Resource Strain: Not on file  Food Insecurity: Not on file  Transportation Needs: Not on file  Physical Activity: Not on file  Stress: Not on file  Social Connections: Not on file     Family History: The patient's family history includes Alzheimer's disease in her father; Cancer in her mother. ROS:   Please see the history of present illness.    All other systems are reviewed and are negative.   EKGs/Labs/Other Studies Reviewed:    The following studies were reviewed today:  EKG:  EKG is ordered today and shows normal sinus rhythm, completely normal tracing. Recent Labs: No results found for requested labs within last 8760 hours.   Recent Lipid Panel No results found for: CHOL, TRIG, HDL, CHOLHDL, VLDL, LDLCALC, LDLDIRECT  Physical Exam:    VS:  BP 112/80   Pulse 75   Ht 5\' 4"  (1.626 m)   Wt 142 lb 6.4 oz (64.6 kg)   SpO2 98%   BMI 24.44 kg/m     Wt Readings from Last 3 Encounters:  08/19/20 142 lb 6.4 oz (64.6 kg)  06/26/19 146 lb 9.6 oz (66.5 kg)  06/24/18 140 lb (63.5 kg)     General: Alert, oriented x3, no distress, she appears very fit, younger than stated age Head: no evidence of trauma, PERRL, EOMI, no exophtalmos or lid lag, no myxedema, no xanthelasma; normal ears, nose and oropharynx Neck: normal jugular venous pulsations and no hepatojugular reflux; brisk carotid pulses without delay and no carotid bruits Chest: clear to auscultation, no signs of consolidation by percussion or palpation, normal fremitus, symmetrical and full respiratory  excursions Cardiovascular: normal position and quality of the apical impulse, regular rhythm, normal first and second heart sounds, no murmurs, rubs or gallops Abdomen: no tenderness or distention, no masses by palpation, no abnormal pulsatility or arterial bruits, normal bowel sounds, no hepatosplenomegaly Extremities: no clubbing, cyanosis or edema; 2+ radial, ulnar and brachial pulses bilaterally; 2+ right femoral, posterior tibial and dorsalis pedis pulses; 2+ left femoral, posterior tibial and dorsalis pedis pulses; no subclavian or femoral bruits Neurological: grossly nonfocal Psych: Normal mood and affect   ASSESSMENT:    1. PAF (paroxysmal atrial fibrillation) (HCC)    PLAN:    In order of problems listed above:  1. PAF: CHA2DS2-VASc 3 (age and gender).  She is minimally symptomatic and only takes  a low-dose of beta-blocker very infrequently.  She is on anticoagulation and has not had any bleeding complaints.  Antiarrhythmics are not indicated.  Discussed the fact that the natural history of atrial fibrillation is to gradually get more prevalent with age.  Also reviewed the fact that we will always have a discussion regarding the risk-benefit balance of anticoagulation since the risk of bleeding and falls goes up with age as well.  At this point, the benefit of anticoagulation continues to greatly override the risk of bleeding. 2. Hypokalemia: Not clearly explained, but has resolved and has not recurred.  Most recent potassium level was 4.2.  She is not on diuretics.  Medication Adjustments/Labs and Tests Ordered: Current medicines are reviewed at length with the patient today.  Concerns regarding medicines are outlined above. Labs and tests ordered and medication changes are outlined in the patient instructions below:  Patient Instructions  Medication Instructions:  No changes *If you need a refill on your cardiac medications before your next appointment, please call your  pharmacy*   Lab Work: None ordered If you have labs (blood work) drawn today and your tests are completely normal, you will receive your results only by: Marland Kitchen MyChart Message (if you have MyChart) OR . A paper copy in the mail If you have any lab test that is abnormal or we need to change your treatment, we will call you to review the results.   Testing/Procedures: None ordered   Follow-Up: At [facility], you and your health needs are our priority.  As part of our continuing mission to provide you with exceptional heart care, we have created designated Provider Care Teams.  These Care Teams include your primary Cardiologist (physician) and Advanced Practice Providers (APPs -  Physician Assistants and Nurse Practitioners) who all work together to provide you with the care you need, when you need it.  We recommend signing up for the patient portal called "MyChart".  Sign up information is provided on this After Visit Summary.  MyChart is used to connect with patients for Virtual Visits (Telemedicine).  Patients are able to view lab/test results, encounter notes, upcoming appointments, etc.  Non-urgent messages can be sent to your provider as well.   To learn more about what you can do with MyChart, go to [email or web address].    Your next appointment:   12 month(s)  The format for your next appointment:   In Person  Provider:   You may see Thurmon Fair, MD or one of the following Advanced Practice Providers on your designated Care Team:    Azalee Course, PA-C  Micah Flesher, [place] or   Judy Pimple, PA-C       Signed, Thurmon Fair, MD  08/19/2020 6:46 PM    [facility]

## 2020-08-19 NOTE — Patient Instructions (Signed)

## 2020-08-24 ENCOUNTER — Other Ambulatory Visit: Payer: Self-pay | Admitting: Cardiovascular Disease

## 2020-08-25 ENCOUNTER — Encounter (INDEPENDENT_AMBULATORY_CARE_PROVIDER_SITE_OTHER): Payer: Self-pay | Admitting: Otolaryngology

## 2020-08-25 ENCOUNTER — Ambulatory Visit (INDEPENDENT_AMBULATORY_CARE_PROVIDER_SITE_OTHER): Payer: Medicare PPO | Admitting: Otolaryngology

## 2020-08-25 ENCOUNTER — Other Ambulatory Visit: Payer: Self-pay

## 2020-08-25 VITALS — Temp 97.2°F

## 2020-08-25 DIAGNOSIS — H60312 Diffuse otitis externa, left ear: Secondary | ICD-10-CM

## 2020-08-25 NOTE — Telephone Encounter (Signed)
59f, 64.6kg, Creatinine, Serum 0.660 mg/ 10/09/2019, lovw/croitoru 08/19/20

## 2020-08-25 NOTE — Progress Notes (Signed)
HPI: Amber Mccarthy is a 80 y.o. female who returns today for evaluation of left ear and patient with chronic left external otitis.  She is doing much better presently with no drainage no pain and no blockage of the left ear..  Past Medical History:  Diagnosis Date  . Colon polyps   . PAF (paroxysmal atrial fibrillation) (HCC) 09/2014   a.  Echo 6/16:  Vigorous LVF, EF 65-70%, no RWMA, Gr 1 DD   Past Surgical History:  Procedure Laterality Date  . CESAREAN SECTION  1987   Social History   Socioeconomic History  . Marital status: Married    Spouse name: [name]  . Number of children: 1  . Years of education: Not on file  . Highest education level: Not on file  Occupational History  . Occupation: Doctor, general practice    Comment: Retired from [employer]  . Smoking status: Former Smoker    Packs/day: 1.00    Years: 5.00    Pack years: 5.00    Start date: 1965    Quit date: 1970    Years since quitting: 52.3  . Smokeless tobacco: Never Used  . Tobacco comment: quit 1970s  Substance and Sexual Activity  . Alcohol use: Yes    Alcohol/week: 7.0 standard drinks    Types: 7 Standard drinks or equivalent per week    Comment: 1 glass of wine per night--> since afib she has decreased  . Drug use: No  . Sexual activity: Not on file  Other Topics Concern  . Not on file  Social History Narrative   Pt lives in [place] with spouse.  Retired.  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   Social Determinants of Health   Financial Resource Strain: Not on file  Food Insecurity: Not on file  Transportation Needs: Not on file  Physical Activity: Not on file  Stress: Not on file  Social Connections: Not on file   Family History  Problem Relation Age of Onset  . Cancer Mother        angiosarcoma  . Alzheimer's disease Father    No Known Allergies Prior to Admission medications   Medication Sig Start Date End Date Taking?  Authorizing Provider  Calcium Carb-Cholecalciferol (CALCIUM-VITAMIN D) 500-200 MG-UNIT tablet Take 2 tablets by mouth daily.    [provider]  dorzolamide-timolol (COSOPT) 22.3-6.8 MG/ML ophthalmic solution  05/22/19   [provider]  ELIQUIS 5 MG TABS tablet TAKE 1 TABLET (5 MG TOTAL) BY MOUTH 2 (TWO) TIMES DAILY FOR 30 DAYS. 09/07/19   Croitoru, Mihai, MD  latanoprost (XALATAN) 0.005 % ophthalmic solution  04/30/19   [provider]  metoprolol tartrate (LOPRESSOR) 25 MG tablet Take 0.5 tablets (12.5 mg total) by mouth as needed. Take half a tablet as needed for palpitations or elevated heart rate 08/19/20   Croitoru, Mihai, MD  Multiple Vitamin (MULTIVITAMIN) capsule Take 1 capsule by mouth daily. Ultra Women 50+    [provider]  vitamin B-12 (CYANOCOBALAMIN) 1000 MCG tablet Take 1,000 mcg by mouth daily.    [provider]  vitamin C (ASCORBIC ACID) 500 MG tablet Take 500 mg by mouth daily.    [provider]     Positive ROS: Otherwise negative  All other systems have been reviewed and were otherwise negative with the exception of those mentioned in the HPI and as above.  Physical Exam: Constitutional: Alert, well-appearing, no acute distress  Ears: External ears without lesions or tenderness.  Left ear canal with gentian violet in place no evidence of external otitis.  No granulation tissue no fungal elements.  External otitis appears to have cleared. Nasal: External nose without lesions.. Clear nasal passages Oral: Lips and gums without lesions. Tongue and palate mucosa without lesions. Posterior oropharynx clear. Neck: No palpable adenopathy or masses Respiratory: Breathing comfortably  Skin: No facial/neck lesions or rash noted.  Procedures  Assessment: Resolved left external otitis.  Plan: Recommended keeping it dry for another week and then can go swimming and get water in the ear.  As she likes to swim for exercise. She  will follow-up as needed.   Narda Bonds, MD

## 2020-08-31 DIAGNOSIS — M545 Low back pain, unspecified: Secondary | ICD-10-CM | POA: Diagnosis not present

## 2020-09-26 DIAGNOSIS — M545 Low back pain, unspecified: Secondary | ICD-10-CM | POA: Diagnosis not present

## 2020-10-03 DIAGNOSIS — M545 Low back pain, unspecified: Secondary | ICD-10-CM | POA: Diagnosis not present

## 2020-10-07 DIAGNOSIS — H401122 Primary open-angle glaucoma, left eye, moderate stage: Secondary | ICD-10-CM | POA: Diagnosis not present

## 2020-10-11 DIAGNOSIS — R5383 Other fatigue: Secondary | ICD-10-CM | POA: Diagnosis not present

## 2020-10-11 DIAGNOSIS — E7800 Pure hypercholesterolemia, unspecified: Secondary | ICD-10-CM | POA: Diagnosis not present

## 2020-10-11 DIAGNOSIS — I480 Paroxysmal atrial fibrillation: Secondary | ICD-10-CM | POA: Diagnosis not present

## 2020-10-11 DIAGNOSIS — Z5181 Encounter for therapeutic drug level monitoring: Secondary | ICD-10-CM | POA: Diagnosis not present

## 2020-10-11 DIAGNOSIS — E2839 Other primary ovarian failure: Secondary | ICD-10-CM | POA: Diagnosis not present

## 2020-10-11 DIAGNOSIS — Z0000 Encounter for general adult medical examination without abnormal findings: Secondary | ICD-10-CM | POA: Diagnosis not present

## 2020-10-11 DIAGNOSIS — E559 Vitamin D deficiency, unspecified: Secondary | ICD-10-CM | POA: Diagnosis not present

## 2020-10-11 DIAGNOSIS — Z23 Encounter for immunization: Secondary | ICD-10-CM | POA: Diagnosis not present

## 2020-10-11 DIAGNOSIS — M5136 Other intervertebral disc degeneration, lumbar region: Secondary | ICD-10-CM | POA: Diagnosis not present

## 2020-11-07 DIAGNOSIS — M533 Sacrococcygeal disorders, not elsewhere classified: Secondary | ICD-10-CM | POA: Diagnosis not present

## 2020-11-22 DIAGNOSIS — M533 Sacrococcygeal disorders, not elsewhere classified: Secondary | ICD-10-CM | POA: Diagnosis not present

## 2020-12-05 DIAGNOSIS — L738 Other specified follicular disorders: Secondary | ICD-10-CM | POA: Diagnosis not present

## 2020-12-05 DIAGNOSIS — Z85828 Personal history of other malignant neoplasm of skin: Secondary | ICD-10-CM | POA: Diagnosis not present

## 2020-12-05 DIAGNOSIS — L821 Other seborrheic keratosis: Secondary | ICD-10-CM | POA: Diagnosis not present

## 2020-12-20 DIAGNOSIS — M533 Sacrococcygeal disorders, not elsewhere classified: Secondary | ICD-10-CM | POA: Diagnosis not present

## 2021-01-10 DIAGNOSIS — M25551 Pain in right hip: Secondary | ICD-10-CM | POA: Diagnosis not present

## 2021-01-10 DIAGNOSIS — R269 Unspecified abnormalities of gait and mobility: Secondary | ICD-10-CM | POA: Diagnosis not present

## 2021-01-10 DIAGNOSIS — M25552 Pain in left hip: Secondary | ICD-10-CM | POA: Diagnosis not present

## 2021-01-10 DIAGNOSIS — M545 Low back pain, unspecified: Secondary | ICD-10-CM | POA: Diagnosis not present

## 2021-01-13 DIAGNOSIS — Z23 Encounter for immunization: Secondary | ICD-10-CM | POA: Diagnosis not present

## 2021-01-27 DIAGNOSIS — M545 Low back pain, unspecified: Secondary | ICD-10-CM | POA: Diagnosis not present

## 2021-01-27 DIAGNOSIS — M25552 Pain in left hip: Secondary | ICD-10-CM | POA: Diagnosis not present

## 2021-01-27 DIAGNOSIS — M25551 Pain in right hip: Secondary | ICD-10-CM | POA: Diagnosis not present

## 2021-01-27 DIAGNOSIS — R269 Unspecified abnormalities of gait and mobility: Secondary | ICD-10-CM | POA: Diagnosis not present

## 2021-01-28 ENCOUNTER — Other Ambulatory Visit: Payer: Self-pay | Admitting: Cardiovascular Disease

## 2021-01-30 NOTE — Telephone Encounter (Signed)
Prescription refill request for Eliquis received. Indication:afib Last office visit:croitoru 08/09/20 Scr:0.680 mg/ 10/11/2020 Age: 27f Weight:64.6kg

## 2021-02-10 DIAGNOSIS — H401122 Primary open-angle glaucoma, left eye, moderate stage: Secondary | ICD-10-CM | POA: Diagnosis not present

## 2021-02-16 DIAGNOSIS — R269 Unspecified abnormalities of gait and mobility: Secondary | ICD-10-CM | POA: Diagnosis not present

## 2021-02-16 DIAGNOSIS — M545 Low back pain, unspecified: Secondary | ICD-10-CM | POA: Diagnosis not present

## 2021-02-16 DIAGNOSIS — M25551 Pain in right hip: Secondary | ICD-10-CM | POA: Diagnosis not present

## 2021-02-16 DIAGNOSIS — M25552 Pain in left hip: Secondary | ICD-10-CM | POA: Diagnosis not present

## 2021-03-23 DIAGNOSIS — M25552 Pain in left hip: Secondary | ICD-10-CM | POA: Diagnosis not present

## 2021-03-23 DIAGNOSIS — R269 Unspecified abnormalities of gait and mobility: Secondary | ICD-10-CM | POA: Diagnosis not present

## 2021-03-23 DIAGNOSIS — M545 Low back pain, unspecified: Secondary | ICD-10-CM | POA: Diagnosis not present

## 2021-03-23 DIAGNOSIS — M25551 Pain in right hip: Secondary | ICD-10-CM | POA: Diagnosis not present

## 2021-03-29 DIAGNOSIS — Z85828 Personal history of other malignant neoplasm of skin: Secondary | ICD-10-CM | POA: Diagnosis not present

## 2021-03-29 DIAGNOSIS — L821 Other seborrheic keratosis: Secondary | ICD-10-CM | POA: Diagnosis not present

## 2021-03-29 DIAGNOSIS — D2261 Melanocytic nevi of right upper limb, including shoulder: Secondary | ICD-10-CM | POA: Diagnosis not present

## 2021-03-29 DIAGNOSIS — L814 Other melanin hyperpigmentation: Secondary | ICD-10-CM | POA: Diagnosis not present

## 2021-03-29 DIAGNOSIS — D2239 Melanocytic nevi of other parts of face: Secondary | ICD-10-CM | POA: Diagnosis not present

## 2021-03-29 DIAGNOSIS — L720 Epidermal cyst: Secondary | ICD-10-CM | POA: Diagnosis not present

## 2021-04-03 DIAGNOSIS — Z1231 Encounter for screening mammogram for malignant neoplasm of breast: Secondary | ICD-10-CM | POA: Diagnosis not present

## 2021-04-03 DIAGNOSIS — Z780 Asymptomatic menopausal state: Secondary | ICD-10-CM | POA: Diagnosis not present

## 2021-04-03 DIAGNOSIS — M85851 Other specified disorders of bone density and structure, right thigh: Secondary | ICD-10-CM | POA: Diagnosis not present

## 2021-04-03 DIAGNOSIS — M85852 Other specified disorders of bone density and structure, left thigh: Secondary | ICD-10-CM | POA: Diagnosis not present

## 2021-04-06 DIAGNOSIS — R490 Dysphonia: Secondary | ICD-10-CM | POA: Diagnosis not present

## 2021-04-06 DIAGNOSIS — E7800 Pure hypercholesterolemia, unspecified: Secondary | ICD-10-CM | POA: Diagnosis not present

## 2021-04-06 DIAGNOSIS — M533 Sacrococcygeal disorders, not elsewhere classified: Secondary | ICD-10-CM | POA: Diagnosis not present

## 2021-04-11 DIAGNOSIS — E7800 Pure hypercholesterolemia, unspecified: Secondary | ICD-10-CM | POA: Diagnosis not present

## 2021-04-27 DIAGNOSIS — R269 Unspecified abnormalities of gait and mobility: Secondary | ICD-10-CM | POA: Diagnosis not present

## 2021-04-27 DIAGNOSIS — M545 Low back pain, unspecified: Secondary | ICD-10-CM | POA: Diagnosis not present

## 2021-04-27 DIAGNOSIS — M25551 Pain in right hip: Secondary | ICD-10-CM | POA: Diagnosis not present

## 2021-04-27 DIAGNOSIS — M25552 Pain in left hip: Secondary | ICD-10-CM | POA: Diagnosis not present

## 2021-05-30 ENCOUNTER — Other Ambulatory Visit (HOSPITAL_BASED_OUTPATIENT_CLINIC_OR_DEPARTMENT_OTHER): Payer: Self-pay | Admitting: *Deleted

## 2021-05-30 ENCOUNTER — Ambulatory Visit (HOSPITAL_BASED_OUTPATIENT_CLINIC_OR_DEPARTMENT_OTHER): Payer: Medicare PPO | Admitting: General Practice

## 2021-05-30 ENCOUNTER — Other Ambulatory Visit: Payer: Self-pay

## 2021-05-30 ENCOUNTER — Encounter (HOSPITAL_BASED_OUTPATIENT_CLINIC_OR_DEPARTMENT_OTHER): Payer: Self-pay | Admitting: General Practice

## 2021-05-30 ENCOUNTER — Telehealth: Payer: Self-pay | Admitting: General Practice

## 2021-05-30 VITALS — BP 132/80 | HR 72 | Ht 65.0 in | Wt 142.0 lb

## 2021-05-30 DIAGNOSIS — I480 Paroxysmal atrial fibrillation: Secondary | ICD-10-CM | POA: Diagnosis not present

## 2021-05-30 DIAGNOSIS — I10 Essential (primary) hypertension: Secondary | ICD-10-CM | POA: Diagnosis not present

## 2021-05-30 NOTE — Patient Instructions (Addendum)
Medication Instructions:  Your physician recommends that you continue on your current medications as directed. Please refer to the Current Medication list given to you today.  *If you need a refill on your cardiac medications before your next appointment, please call your pharmacy*   Lab Work: TODAY:  BMP, CBC, & TSH  If you have labs (blood work) drawn today and your tests are completely normal, you will receive your results only by: MyChart Message (if you have MyChart) OR A paper copy in the mail If you have any lab test that is abnormal or we need to change your treatment, we will call you to review the results.   Testing/Procedures: None ordered   Follow-Up: At [facility], you and your health needs are our priority.  As part of our continuing mission to provide you with exceptional heart care, we have created designated Provider Care Teams.  These Care Teams include your primary Cardiologist (physician) and Advanced Practice Providers (APPs -  Physician Assistants and Nurse Practitioners) who all work together to provide you with the care you need, when you need it.  We recommend signing up for the patient portal called "MyChart".  Sign up information is provided on this After Visit Summary.  MyChart is used to connect with patients for Virtual Visits (Telemedicine).  Patients are able to view lab/test results, encounter notes, upcoming appointments, etc.  Non-urgent messages can be sent to your provider as well.   To learn more about what you can do with MyChart, go to [email or web address].    Your next appointment:   12 month(s)  The format for your next appointment:   In Person  Provider:   Thurmon Fair, MD     Other Instructions  Gastroesophageal Reflux Disease, Adult Gastroesophageal reflux (GER) happens when acid from the stomach flows up into the tube that connects the mouth and the stomach (esophagus). Normally, food travels down the esophagus and stays in  the stomach to be digested. With GER, food and stomach acid sometimes move back up into the esophagus. You may have a disease called gastroesophageal reflux disease (GERD) if the reflux: Happens often. Causes frequent or very bad symptoms. Causes problems such as damage to the esophagus. When this happens, the esophagus becomes sore and swollen. Over time, GERD can make small holes (ulcers) in the lining of the esophagus. What are the causes? This condition is caused by a problem with the muscle between the esophagus and the stomach. When this muscle is weak or not normal, it does not close properly to keep food and acid from coming back up from the stomach. The muscle can be weak because of: Tobacco use. Pregnancy. Having a certain type of hernia (hiatal hernia). Alcohol use. Certain foods and drinks, such as coffee, chocolate, onions, and peppermint. What increases the risk? Being overweight. Having a disease that affects your connective tissue. Taking NSAIDs, such a ibuprofen. What are the signs or symptoms? Heartburn. Difficult or painful swallowing. The feeling of having a lump in the throat. A bitter taste in the mouth. Bad breath. Having a lot of saliva. Having an upset or bloated stomach. Burping. Chest pain. Different conditions can cause chest pain. Make sure you see your doctor if you have chest pain. Shortness of breath or wheezing. A long-term cough or a cough at night. Wearing away of the surface of teeth (tooth enamel). Weight loss. How is this treated? Making changes to your diet. Taking medicine. Having surgery. Treatment will depend on how  bad your symptoms are. Follow these instructions at home: Eating and drinking  Follow a diet as told by your doctor. You may need to avoid foods and drinks such as: Coffee and tea, with or without caffeine. Drinks that contain alcohol. Energy drinks and sports drinks. Bubbly (carbonated) drinks or sodas. Chocolate and  cocoa. Peppermint and mint flavorings. Garlic and onions. Horseradish. Spicy and acidic foods. These include peppers, chili powder, curry powder, vinegar, hot sauces, and BBQ sauce. Citrus fruit juices and citrus fruits, such as oranges, lemons, and limes. Tomato-based foods. These include red sauce, chili, salsa, and pizza with red sauce. Fried and fatty foods. These include donuts, french fries, potato chips, and high-fat dressings. High-fat meats. These include hot dogs, rib eye steak, sausage, ham, and bacon. High-fat dairy items, such as whole milk, butter, and cream cheese. Eat small meals often. Avoid eating large meals. Avoid drinking large amounts of liquid with your meals. Avoid eating meals during the 2-3 hours before bedtime. Avoid lying down right after you eat. Do not exercise right after you eat. Lifestyle  Do not smoke or use any products that contain nicotine or tobacco. If you need help quitting, ask your doctor. Try to lower your stress. If you need help doing this, ask your doctor. If you are overweight, lose an amount of weight that is healthy for you. Ask your doctor about a safe weight loss goal. General instructions Pay attention to any changes in your symptoms. Take over-the-counter and prescription medicines only as told by your doctor. Do not take aspirin, ibuprofen, or other NSAIDs unless your doctor says it is okay. Wear loose clothes. Do not wear anything tight around your waist. Raise (elevate) the head of your bed about 6 inches (15 cm). You may need to use a wedge to do this. Avoid bending over if this makes your symptoms worse. Keep all follow-up visits. Contact a doctor if: You have new symptoms. You lose weight and you do not know why. You have trouble swallowing or it hurts to swallow. You have wheezing or a cough that keeps happening. You have a hoarse voice. Your symptoms do not get better with treatment. Get help right away if: You have  sudden pain in your arms, neck, jaw, teeth, or back. You suddenly feel sweaty, dizzy, or light-headed. You have chest pain or shortness of breath. You vomit and the vomit is green, yellow, or black, or it looks like blood or coffee grounds. You faint. Your poop (stool) is red, bloody, or black. You cannot swallow, drink, or eat. These symptoms may represent a serious problem that is an emergency. Do not wait to see if the symptoms will go away. Get medical help right away. Call your local emergency services (911 in the [place] Do not drive yourself to the hospital. Summary If a person has gastroesophageal reflux disease (GERD), food and stomach acid move back up into the esophagus and cause symptoms or problems such as damage to the esophagus. Treatment will depend on how bad your symptoms are. Follow a diet as told by your doctor. Take all medicines only as told by your doctor. This information is not intended to replace advice given to you by your health care provider. Make sure you discuss any questions you have with your health care provider. Document Revised: 10/19/2019 Document Reviewed: 10/19/2019 Elsevier Patient Education  2022 [employer].

## 2021-05-30 NOTE — Progress Notes (Addendum)
Cardiology Office Note:    Date:  05/30/2021   ID:  Amber Mccarthy, DOB 10/23/1940, MRN [id number]  PCP:  Glenis Smoker, MD   [facility] Providers Cardiologist:  Sanda Klein, MD      Referring MD: Glenis Smoker, *   Evaluation of hypertension and PAF  History of Present Illness:    Amber Mccarthy is a 81 y.o. female with a hx of paroxysmal atrial fibrillation and hypertension.  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  She presented to the emergency room 2/20 with an episode of atrial fibrillation.  During that time she underwent direct-current cardioversion in the emergency room and maintained sinus rhythm.  It was not clear why she went into the atrial fibrillation.  She was noted to be hypokalemic at that time.  She did note frequent episodes of brief palpitations for a few weeks after the cardioversion.  She treated the palpitations with metoprolol.  She did note heaviness in her legs which improved with ambulation.  As she would enter into her second mile of walking the sensation go away.  She was noted to have normal lower extremity Dopplers and ABIs 9/21.  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  However, she has been improving with physical therapy.  She was seen by Dr. Sallyanne Kuster 08/19/2020.  She reported compliance with her anticoagulation and denied bleeding issues, injuries and falls.  Her echocardiogram 2016 showed an EF of 65-70%.  She contacted nurse triage line today after contacting her PCP.  She reported that late Sunday early Monday morning she had pain below her left breast, clammy hands, dizziness, lightheadedness, and dry mouth with being very thirsty.  She also noted flushed face and more frequent urination.  She reported that her watch did not show atrial fibrillation.  Her blood pressure  was 125/83 and on recheck at 119/84.  She presents to the clinic today for evaluation of her blood pressure.  She states she noticed symptoms late Sunday early Monday.  Detailed symptoms above.  She reports that she has been eating an increased amount of citrus fruits and was eating salad with apple cider vinegar dressing.  She denies palpitations.  She states that she has had random episodes of heartburn in the past that felt very similar to what she had this episode.  Her EKG today shows normal sinus rhythm 72 bpm.  She reports compliance with her Eliquis medication denies bleeding issues.  I will order a CBC, BMP, TSH, and give GERD diet instructions.  We will have her follow-up with 9-12 months.   Today she denies chest pain, shortness of breath, lower extremity edema, fatigue, palpitations, melena, hematuria, hemoptysis, diaphoresis, weakness, presyncope, syncope, orthopnea, and PND.   Past Medical History:  Diagnosis Date   Colon polyps    PAF (paroxysmal atrial fibrillation) ([place]) 09/2014   a.  Echo 6/16:  Vigorous LVF, EF 65-70%, no RWMA, Gr 1 DD    Past Surgical History:  Procedure Laterality Date   CESAREAN SECTION  1987    Current Medications: Current Meds  Medication Sig   Calcium Carb-Cholecalciferol (CALCIUM-VITAMIN D) 500-200 MG-UNIT tablet Take 2 tablets by mouth daily.   dorzolamide-timolol (COSOPT) 22.3-6.8 MG/ML ophthalmic solution    ELIQUIS 5 MG TABS tablet TAKE 1 TABLET BY MOUTH 2 TIMES DAILY FOR 30 DAYS.   latanoprost (  XALATAN) 0.005 % ophthalmic solution    metoprolol tartrate (LOPRESSOR) 25 MG tablet Take 0.5 tablets (12.5 mg total) by mouth as needed. Take half a tablet as needed for palpitations or elevated heart rate   Multiple Vitamin (MULTIVITAMIN) capsule Take 1 capsule by mouth daily. Ultra Women 50+   pravastatin (PRAVACHOL) 20 MG tablet Take 20 mg by mouth 2 (two) times a week.   vitamin B-12 (CYANOCOBALAMIN) 1000 MCG tablet Take 1,000 mcg by mouth  daily.   vitamin C (ASCORBIC ACID) 500 MG tablet Take 500 mg by mouth daily.     Allergies:   Patient has no known allergies.   Social History   Socioeconomic History   Marital status: Married    Spouse name: Marlyce Huge   Number of children: 1   Years of education: Not on file   Highest education level: Not on file  Occupational History   Occupation: Electrical engineer    Comment: Retired from [employer]   Smoking status: Former    Packs/day: 1.00    Years: 5.00    Pack years: 5.00    Types: Cigarettes    Start date: 1965    Quit date: 1970    Years since quitting: 53.1   Smokeless tobacco: Never   Tobacco comments:    quit 1970s  Substance and Sexual Activity   Alcohol use: Yes    Alcohol/week: 7.0 standard drinks    Types: 7 Standard drinks or equivalent per week    Comment: 1 glass of wine per night--> since afib she has decreased   Drug use: No   Sexual activity: Not on file  Other Topics Concern   Not on file  Social History Narrative   Pt lives in [place] with spouse.  Retired.  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   Social Determinants of Health   Financial Resource Strain: Not on file  Food Insecurity: Not on file  Transportation Needs: Not on file  Physical Activity: Not on file  Stress: Not on file  Social Connections: Not on file     Family History: The patient's family history includes Alzheimer's disease in her father; Cancer in her mother.  ROS:   Please see the history of present illness.      All other systems reviewed and are negative.   Risk Assessment/Calculations:           Physical Exam:    VS:  BP 132/80    Pulse 72    Ht 5\' 5"  (1.651 m)    Wt 142 lb (64.4 kg)    SpO2 99%    BMI 23.63 kg/m     Wt Readings from Last 3 Encounters:  05/30/21 142 lb (64.4 kg)  08/19/20 142 lb 6.4 oz (64.6 kg)  06/26/19 146 lb 9.6 oz (66.5 kg)     GEN:  Well nourished, well developed in  no acute distress HEENT: Normal NECK: No JVD; No carotid bruits LYMPHATICS: No lymphadenopathy CARDIAC:  RRR, no murmurs, rubs, gallops RESPIRATORY:  Clear to auscultation without rales, wheezing or rhonchi  ABDOMEN: Soft, non-tender, non-distended MUSCULOSKELETAL:  No edema; No deformity  SKIN: Warm and dry NEUROLOGIC:  Alert and oriented x 3 PSYCHIATRIC:  Normal affect    EKGs/Labs/Other Studies Reviewed:    The following studies were reviewed today: Echocardiogram 10/07/2014 Study Conclusions   - Left ventricle: The cavity size was normal. Wall thickness was    normal.  Systolic function was vigorous. The estimated ejection    fraction was in the range of 65% to 70%. Wall motion was normal;    there were no regional wall motion abnormalities. Doppler    parameters are consistent with abnormal left ventricular    relaxation (grade 1 diastolic dysfunction). The E/e&' ratio is <8,    suggesting normal LV filling pressure.  - Mitral valve: Structurally normal valve. There was trivial    regurgitation.  - Left atrium: The atrium was normal in size.   Impressions:   - LVEF 65-70%, normal wall thickness, normal wall motion, diastolic    dysfunction, normal LV filling pressure, normal LA size.   Transthoracic echocardiography.  M-mode, complete 2D, spectral  Doppler, and color Doppler.  Birthdate:  Patient birthdate:  1940-09-24.  Age:  Patient is 81 yr old.  Sex:  Gender: female.  BMI: 23.1 kg/m^2.  Patient status:  Outpatient.  Study date:  Study  date: 10/07/2014. Study time: 10:49 AM.  Location:  [facility]  3   EKG:  EKG is  ordered today.  The ekg ordered today demonstrates normal sinus rhythm no ST or T wave deviation 72 bpm  Recent Labs: No results found for requested labs within last 8760 hours.  Recent Lipid Panel No results found for: CHOL, TRIG, HDL, CHOLHDL, VLDL, LDLCALC, LDLDIRECT  ASSESSMENT & PLAN    Paroxysmal atrial fibrillation-contacted nurse  triage line today 05/30/21 reported late Sunday and early Monday she noticed increased thirst, increased urination, pain below her left breast, clammy hands, dizziness, lightheadedness, flushed feeling.  Her PCP instructed her to contact cardiology.  EKG today shows NSR.  Reports compliance with apixaban and denies bleeding issues. Continue apixaban, metoprolol Heart healthy low-sodium diet Increase physical activity as tolerated Avoid triggers caffeine, chocolate, EtOH, dehydration etc. Order BMP, CBC, TSH  Essential hypertension-BP today 132/80.  Well-controlled at home.  Reports blood pressures in the 120s over 80s Continue metoprolol Heart healthy low-sodium diet-salty 6 given Increase physical activity as tolerated  GRED-more citrus fruit and apple cider vingar.  Reports episodes with discomfort under her left chest that feels similar to heartburn.  She reports having the same symptoms previously. GERD diet instructions Reassured that her issues were not related to her heart.  Disposition: Follow-up in 9-12 months.   Medication Adjustments/Labs and Tests Ordered: Current medicines are reviewed at length with the patient today.  Concerns regarding medicines are outlined above.  Orders Placed This Encounter  Procedures   Basic Metabolic Panel (BMET)   TSH   TSH   EKG 12-Lead   No orders of the defined types were placed in this encounter.   Patient Instructions  Medication Instructions:  Your physician recommends that you continue on your current medications as directed. Please refer to the Current Medication list given to you today.  *If you need a refill on your cardiac medications before your next appointment, please call your pharmacy*   Lab Work: TODAY:  BMP, CBC, & TSH  If you have labs (blood work) drawn today and your tests are completely normal, you will receive your results only by: [place] (if you have MyChart) OR A paper copy in the mail If you have any  lab test that is abnormal or we need to change your treatment, we will call you to review the results.   Testing/Procedures: None ordered   Follow-Up: At [facility], you and your health needs are our priority.  As part of our continuing  mission to provide you with exceptional heart care, we have created designated Provider Care Teams.  These Care Teams include your primary Cardiologist (physician) and Advanced Practice Providers (APPs -  Physician Assistants and Nurse Practitioners) who all work together to provide you with the care you need, when you need it.  We recommend signing up for the patient portal called "MyChart".  Sign up information is provided on this After Visit Summary.  MyChart is used to connect with patients for Virtual Visits (Telemedicine).  Patients are able to view lab/test results, encounter notes, upcoming appointments, etc.  Non-urgent messages can be sent to your provider as well.   To learn more about what you can do with MyChart, go to [email or web address].    Your next appointment:   12 month(s)  The format for your next appointment:   In Person  Provider:   Sanda Klein, MD     Other Instructions  Gastroesophageal Reflux Disease, Adult Gastroesophageal reflux (GER) happens when acid from the stomach flows up into the tube that connects the mouth and the stomach (esophagus). Normally, food travels down the esophagus and stays in the stomach to be digested. With GER, food and stomach acid sometimes move back up into the esophagus. You may have a disease called gastroesophageal reflux disease (GERD) if the reflux: Happens often. Causes frequent or very bad symptoms. Causes problems such as damage to the esophagus. When this happens, the esophagus becomes sore and swollen. Over time, GERD can make small holes (ulcers) in the lining of the esophagus. What are the causes? This condition is caused by a problem with the muscle between the esophagus and  the stomach. When this muscle is weak or not normal, it does not close properly to keep food and acid from coming back up from the stomach. The muscle can be weak because of: Tobacco use. Pregnancy. Having a certain type of hernia (hiatal hernia). Alcohol use. Certain foods and drinks, such as coffee, chocolate, onions, and peppermint. What increases the risk? Being overweight. Having a disease that affects your connective tissue. Taking NSAIDs, such a ibuprofen. What are the signs or symptoms? Heartburn. Difficult or painful swallowing. The feeling of having a lump in the throat. A bitter taste in the mouth. Bad breath. Having a lot of saliva. Having an upset or bloated stomach. Burping. Chest pain. Different conditions can cause chest pain. Make sure you see your doctor if you have chest pain. Shortness of breath or wheezing. A long-term cough or a cough at night. Wearing away of the surface of teeth (tooth enamel). Weight loss. How is this treated? Making changes to your diet. Taking medicine. Having surgery. Treatment will depend on how bad your symptoms are. Follow these instructions at home: Eating and drinking  Follow a diet as told by your doctor. You may need to avoid foods and drinks such as: Coffee and tea, with or without caffeine. Drinks that contain alcohol. Energy drinks and sports drinks. Bubbly (carbonated) drinks or sodas. Chocolate and cocoa. Peppermint and mint flavorings. Garlic and onions. Horseradish. Spicy and acidic foods. These include peppers, chili powder, curry powder, vinegar, hot sauces, and BBQ sauce. Citrus fruit juices and citrus fruits, such as oranges, lemons, and limes. Tomato-based foods. These include red sauce, chili, salsa, and pizza with red sauce. Fried and fatty foods. These include donuts, french fries, potato chips, and high-fat dressings. High-fat meats. These include hot dogs, rib eye steak, sausage, ham, and  bacon. High-fat dairy items, such as  whole milk, butter, and cream cheese. Eat small meals often. Avoid eating large meals. Avoid drinking large amounts of liquid with your meals. Avoid eating meals during the 2-3 hours before bedtime. Avoid lying down right after you eat. Do not exercise right after you eat. Lifestyle  Do not smoke or use any products that contain nicotine or tobacco. If you need help quitting, ask your doctor. Try to lower your stress. If you need help doing this, ask your doctor. If you are overweight, lose an amount of weight that is healthy for you. Ask your doctor about a safe weight loss goal. General instructions Pay attention to any changes in your symptoms. Take over-the-counter and prescription medicines only as told by your doctor. Do not take aspirin, ibuprofen, or other NSAIDs unless your doctor says it is okay. Wear loose clothes. Do not wear anything tight around your waist. Raise (elevate) the head of your bed about 6 inches (15 cm). You may need to use a wedge to do this. Avoid bending over if this makes your symptoms worse. Keep all follow-up visits. Contact a doctor if: You have new symptoms. You lose weight and you do not know why. You have trouble swallowing or it hurts to swallow. You have wheezing or a cough that keeps happening. You have a hoarse voice. Your symptoms do not get better with treatment. Get help right away if: You have sudden pain in your arms, neck, jaw, teeth, or back. You suddenly feel sweaty, dizzy, or light-headed. You have chest pain or shortness of breath. You vomit and the vomit is green, yellow, or black, or it looks like blood or coffee grounds. You faint. Your poop (stool) is red, bloody, or black. You cannot swallow, drink, or eat. These symptoms may represent a serious problem that is an emergency. Do not wait to see if the symptoms will go away. Get medical help right away. Call your local emergency services (911  in the [place] Do not drive yourself to the hospital. Summary If a person has gastroesophageal reflux disease (GERD), food and stomach acid move back up into the esophagus and cause symptoms or problems such as damage to the esophagus. Treatment will depend on how bad your symptoms are. Follow a diet as told by your doctor. Take all medicines only as told by your doctor. This information is not intended to replace advice given to you by your health care provider. Make sure you discuss any questions you have with your health care provider. Document Revised: 10/19/2019 Document Reviewed: 10/19/2019 Elsevier Patient Education  2022 [place], Deberah Pelton, NP  05/30/2021 2:13 PM      Notice: This dictation was prepared with Dragon dictation along with smaller phrase technology. Any transcriptional errors that result from this process are unintentional and may not be corrected upon review.  I spent 14 minutes examining this patient, reviewing medications, and using patient centered shared decision making involving her cardiac care.  Prior to her visit I spent greater than 20 minutes reviewing her past medical history,  medications, and prior cardiac tests.

## 2021-05-30 NOTE — Telephone Encounter (Signed)
Pt in the office seeing Edd Fabian, NP at this time.

## 2021-05-30 NOTE — Telephone Encounter (Signed)
Patient called stating Dr. Chanetta Marshall advised her to call the office and schedule an appointment to be seen today. Patient reports she had an episode late Sunday night/early Monday morning where she had pain below her left breast, clammy hands, dizziness, lightheadedness, a dry mouth being very thirsty, flush face, and was urinating more frequently. She reports she had on her watch and it did not report Afib. She has also been having hypertension. BP today was 125/83 at 11:45 AM, and was 119/84 at 12:00 PM. She attempted to be worked in for an appt with her PCP today, but they did not have any availability. Patient has not been seen since episode occurred. Reported no symptoms at time of call. Patient has been scheduled to come in today 02/07 at 1:30 to see Verdon Cummins due to it being the only appt available for today.

## 2021-05-31 ENCOUNTER — Other Ambulatory Visit: Payer: Self-pay | Admitting: *Deleted

## 2021-05-31 ENCOUNTER — Encounter (HOSPITAL_BASED_OUTPATIENT_CLINIC_OR_DEPARTMENT_OTHER): Payer: Self-pay

## 2021-05-31 DIAGNOSIS — I10 Essential (primary) hypertension: Secondary | ICD-10-CM

## 2021-05-31 DIAGNOSIS — I480 Paroxysmal atrial fibrillation: Secondary | ICD-10-CM

## 2021-05-31 LAB — BASIC METABOLIC PANEL
BUN/Creatinine Ratio: 27 (ref 12–28)
BUN: 20 mg/dL (ref 8–27)
CO2: 24 mmol/L (ref 20–29)
Calcium: 10.2 mg/dL (ref 8.7–10.3)
Chloride: 104 mmol/L (ref 96–106)
Creatinine, Ser: 0.75 mg/dL (ref 0.57–1.00)
Glucose: 88 mg/dL (ref 70–99)
Potassium: 4.7 mmol/L (ref 3.5–5.2)
Sodium: 141 mmol/L (ref 134–144)
eGFR: 81 mL/min/{1.73_m2} (ref >59)

## 2021-05-31 LAB — TSH: TSH: 1.42 u[IU]/mL (ref 0.450–4.500)

## 2021-05-31 NOTE — Addendum Note (Signed)
Addended by: Burnetta Sabin on: 05/31/2021 10:36 AM   Modules accepted: Orders

## 2021-06-06 ENCOUNTER — Other Ambulatory Visit: Payer: Self-pay | Admitting: *Deleted

## 2021-06-07 DIAGNOSIS — I480 Paroxysmal atrial fibrillation: Secondary | ICD-10-CM | POA: Diagnosis not present

## 2021-06-07 DIAGNOSIS — I10 Essential (primary) hypertension: Secondary | ICD-10-CM | POA: Diagnosis not present

## 2021-06-08 LAB — CBC
Hematocrit: 44.6 % (ref 34.0–46.6)
Hemoglobin: 15 g/dL (ref 11.1–15.9)
MCH: 32 pg (ref 26.6–33.0)
MCHC: 33.6 g/dL (ref 31.5–35.7)
MCV: 95 fL (ref 79–97)
Platelets: 271 10*3/uL (ref 150–450)
RBC: 4.69 x10E6/uL (ref 3.77–5.28)
RDW: 12.1 % (ref 11.7–15.4)
WBC: 4.9 10*3/uL (ref 3.4–10.8)

## 2021-06-08 LAB — TSH: TSH: 1.35 u[IU]/mL (ref 0.450–4.500)

## 2021-06-13 ENCOUNTER — Encounter: Payer: Self-pay | Admitting: Cardiovascular Disease

## 2021-06-19 DIAGNOSIS — Z961 Presence of intraocular lens: Secondary | ICD-10-CM | POA: Diagnosis not present

## 2021-06-19 DIAGNOSIS — H401122 Primary open-angle glaucoma, left eye, moderate stage: Secondary | ICD-10-CM | POA: Diagnosis not present

## 2021-06-22 DIAGNOSIS — M545 Low back pain, unspecified: Secondary | ICD-10-CM | POA: Diagnosis not present

## 2021-06-22 DIAGNOSIS — R269 Unspecified abnormalities of gait and mobility: Secondary | ICD-10-CM | POA: Diagnosis not present

## 2021-06-22 DIAGNOSIS — M25552 Pain in left hip: Secondary | ICD-10-CM | POA: Diagnosis not present

## 2021-06-22 DIAGNOSIS — M25551 Pain in right hip: Secondary | ICD-10-CM | POA: Diagnosis not present

## 2021-08-08 ENCOUNTER — Other Ambulatory Visit: Payer: Self-pay | Admitting: Cardiovascular Disease

## 2021-08-08 DIAGNOSIS — I480 Paroxysmal atrial fibrillation: Secondary | ICD-10-CM

## 2021-08-08 NOTE — Telephone Encounter (Signed)
Prescription refill request for Eliquis received. ?Indication: Afib  ?Last office visit:05/30/21 Molli Hazard) ?Scr: 0.75 (05/30/21) ?Age: 81 ?Weight: 64.4kg ? ?Appropriate dose and refill sent to requested pharmacy.  ?

## 2021-08-22 DIAGNOSIS — E162 Hypoglycemia, unspecified: Secondary | ICD-10-CM | POA: Diagnosis not present

## 2021-08-22 DIAGNOSIS — R232 Flushing: Secondary | ICD-10-CM | POA: Diagnosis not present

## 2021-10-10 DIAGNOSIS — H0011 Chalazion right upper eyelid: Secondary | ICD-10-CM | POA: Diagnosis not present

## 2021-10-10 DIAGNOSIS — H401122 Primary open-angle glaucoma, left eye, moderate stage: Secondary | ICD-10-CM | POA: Diagnosis not present

## 2021-10-12 DIAGNOSIS — M545 Low back pain, unspecified: Secondary | ICD-10-CM | POA: Diagnosis not present

## 2021-10-12 DIAGNOSIS — M25552 Pain in left hip: Secondary | ICD-10-CM | POA: Diagnosis not present

## 2021-10-12 DIAGNOSIS — M25551 Pain in right hip: Secondary | ICD-10-CM | POA: Diagnosis not present

## 2021-10-12 DIAGNOSIS — R269 Unspecified abnormalities of gait and mobility: Secondary | ICD-10-CM | POA: Diagnosis not present

## 2021-10-16 DIAGNOSIS — R200 Anesthesia of skin: Secondary | ICD-10-CM | POA: Diagnosis not present

## 2021-10-16 DIAGNOSIS — Z0000 Encounter for general adult medical examination without abnormal findings: Secondary | ICD-10-CM | POA: Diagnosis not present

## 2021-10-16 DIAGNOSIS — Z5181 Encounter for therapeutic drug level monitoring: Secondary | ICD-10-CM | POA: Diagnosis not present

## 2021-10-16 DIAGNOSIS — M5136 Other intervertebral disc degeneration, lumbar region: Secondary | ICD-10-CM | POA: Diagnosis not present

## 2021-10-16 DIAGNOSIS — E2839 Other primary ovarian failure: Secondary | ICD-10-CM | POA: Diagnosis not present

## 2021-10-16 DIAGNOSIS — E559 Vitamin D deficiency, unspecified: Secondary | ICD-10-CM | POA: Diagnosis not present

## 2021-10-16 DIAGNOSIS — E7800 Pure hypercholesterolemia, unspecified: Secondary | ICD-10-CM | POA: Diagnosis not present

## 2021-10-16 DIAGNOSIS — I480 Paroxysmal atrial fibrillation: Secondary | ICD-10-CM | POA: Diagnosis not present

## 2021-10-16 DIAGNOSIS — D6869 Other thrombophilia: Secondary | ICD-10-CM | POA: Diagnosis not present

## 2021-11-27 DIAGNOSIS — G4700 Insomnia, unspecified: Secondary | ICD-10-CM | POA: Diagnosis not present

## 2021-11-27 DIAGNOSIS — K52832 Lymphocytic colitis: Secondary | ICD-10-CM | POA: Diagnosis not present

## 2021-11-27 DIAGNOSIS — E162 Hypoglycemia, unspecified: Secondary | ICD-10-CM | POA: Diagnosis not present

## 2021-11-27 DIAGNOSIS — I480 Paroxysmal atrial fibrillation: Secondary | ICD-10-CM | POA: Diagnosis not present

## 2021-11-27 DIAGNOSIS — E559 Vitamin D deficiency, unspecified: Secondary | ICD-10-CM | POA: Diagnosis not present

## 2021-11-27 DIAGNOSIS — E2839 Other primary ovarian failure: Secondary | ICD-10-CM | POA: Diagnosis not present

## 2021-11-27 DIAGNOSIS — D6869 Other thrombophilia: Secondary | ICD-10-CM | POA: Diagnosis not present

## 2021-11-30 ENCOUNTER — Encounter: Payer: Self-pay | Admitting: Cardiovascular Disease

## 2021-11-30 ENCOUNTER — Ambulatory Visit: Payer: Medicare PPO | Admitting: Cardiovascular Disease

## 2021-11-30 VITALS — BP 118/74 | HR 79 | Ht 65.0 in | Wt 140.4 lb

## 2021-11-30 DIAGNOSIS — I480 Paroxysmal atrial fibrillation: Secondary | ICD-10-CM | POA: Diagnosis not present

## 2021-11-30 DIAGNOSIS — D6869 Other thrombophilia: Secondary | ICD-10-CM | POA: Diagnosis not present

## 2021-11-30 NOTE — Patient Instructions (Signed)

## 2021-11-30 NOTE — Progress Notes (Signed)
Cardiology Office Note:    Date:  11/30/2021   ID:  Amber Mccarthy, DOB [birth year], MRN [id number]  PCP:  Shon Hale, MD  Cardiologist:  Thurmon Fair, MD    Referring MD: Shon Hale, *   Chief complaint: atrial fibrillation  History of Present Illness:    Amber Mccarthy is a 81 y.o. female with a hx of infrequent episodes of paroxysmal atrial fibrillation.  She required cardioversion in 2020, but subsequent events have resolved spontaneously.  She keeps a detailed record of her arrhythmia.  She is aware with both clinically and can keep track of it with her smart watch.  She has had an event roughly once every 3 months with episodes lasting between 1.5 hours and 4 hours.  The palpitations are unpleasant, but she does not have problems with dyspnea, angina or syncope during the events.  She continues have a very healthy lifestyle, walking 2 miles daily 7 days a week performing yoga and eating a healthy diet.  She has some hesitation taking the metoprolol, although she has not had any side effects with this.  Continues have some complaints of "heavy legs".  She had a normal lower extremity arterial Doppler study with normal ABIs in September 2021.  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  Denies any falls, injury or bleeding issues on the anticoagulant.  Does have easy bruising.  Her only risk factors for embolic events are age and gender. She does not have vascular disease, hypertension diabetes, history of heart failure or stroke/TIA. Echo in 2016 showed normal left ventricular ejection fraction with EF 65-70% in the left atrium was described as normal in size.   She is a Doctor, general practice, retired head of the speech center at [employer]  She is concerned about the recent diagnosis of cardiomyopathy in her husband Amber Mccarthy.  He has seen Dr. Elberta Fortis for frequent PVCs  and will be seeing me formally as a new general cardiology patient at the end of this month.  They are planning an expensive trip to [place] in October.  Past Medical History:  Diagnosis Date   Colon polyps    PAF (paroxysmal atrial fibrillation) (HCC) 09/2014   a.  Echo 6/16:  Vigorous LVF, EF 65-70%, no RWMA, Gr 1 DD    Past Surgical History:  Procedure Laterality Date   CESAREAN SECTION  1987    Current Medications: Current Meds  Medication Sig   dorzolamide-timolol (COSOPT) 22.3-6.8 MG/ML ophthalmic solution    ELIQUIS 5 MG TABS tablet TAKE 1 TABLET BY MOUTH TWICE A DAY   latanoprost (XALATAN) 0.005 % ophthalmic solution    pravastatin (PRAVACHOL) 20 MG tablet Take 20 mg by mouth 2 (two) times a week.   vitamin B-12 (CYANOCOBALAMIN) 1000 MCG tablet Take 1,000 mcg by mouth daily.     Allergies:   Patient has no known allergies.   Social History   Socioeconomic History   Marital status: Married    Spouse name: Amber Mccarthy   Number of children: 1   Years of education: Not on file   Highest education level: Not on file  Occupational History   Occupation: Doctor, general practice    Comment: Retired from [employer]  Tobacco Use   Smoking status: Former    Packs/day: 1.00    Years: 5.00    Total pack years: 5.00    Types: Cigarettes    Start  date: 36    Quit date: 49    Years since quitting: 53.6   Smokeless tobacco: Never   Tobacco comments:    quit 1970s  Substance and Sexual Activity   Alcohol use: Yes    Alcohol/week: 7.0 standard drinks of alcohol    Types: 7 Standard drinks or equivalent per week    Comment: 1 glass of wine per night--> since afib she has decreased   Drug use: No   Sexual activity: Not on file  Other Topics Concern   Not on file  Social History Narrative   Pt lives in [place] with spouse.  Retired.  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   Social Determinants of Health   Financial Resource  Strain: Not on file  Food Insecurity: Not on file  Transportation Needs: Not on file  Physical Activity: Not on file  Stress: Not on file  Social Connections: Not on file     Family History: The patient's family history includes Alzheimer's disease in her father; Cancer in her mother. ROS:   Please see the history of present illness.    All other systems are reviewed and are negative.   EKGs/Labs/Other Studies Reviewed:    The following studies were reviewed today:  EKG:  EKG is ordered today and shows normal sinus rhythm, completely normal tracing. Recent Labs: 05/30/2021: BUN 20; Creatinine, Ser 0.75; Potassium 4.7; Sodium 141 06/07/2021: Hemoglobin 15.0; Platelets 271; TSH 1.350   Recent Lipid Panel No results found for: "CHOL", "TRIG", "HDL", "CHOLHDL", "VLDL", "LDLCALC", "LDLDIRECT"  Physical Exam:    VS:  BP 118/74 (BP Location: Left Arm, Patient Position: Sitting, Cuff Size: Normal)   Pulse 79   Ht 5\' 5"  (1.651 m)   Wt 140 lb 6.4 oz (63.7 kg)   SpO2 94%   BMI 23.36 kg/m     Wt Readings from Last 3 Encounters:  11/30/21 140 lb 6.4 oz (63.7 kg)  05/30/21 142 lb (64.4 kg)  08/19/20 142 lb 6.4 oz (64.6 kg)      General: Alert, oriented x3, no distress, appears lean and fit, younger than stated age Head: no evidence of trauma, PERRL, EOMI, no exophtalmos or lid lag, no myxedema, no xanthelasma; normal ears, nose and oropharynx Neck: normal jugular venous pulsations and no hepatojugular reflux; brisk carotid pulses without delay and no carotid bruits Chest: clear to auscultation, no signs of consolidation by percussion or palpation, normal fremitus, symmetrical and full respiratory excursions Cardiovascular: normal position and quality of the apical impulse, regular rhythm, normal first and second heart sounds, no murmurs, rubs or gallops Abdomen: no tenderness or distention, no masses by palpation, no abnormal pulsatility or arterial bruits, normal bowel sounds, no  hepatosplenomegaly Extremities: no clubbing, cyanosis or edema; 2+ radial, ulnar and brachial pulses bilaterally; 2+ right femoral, posterior tibial and dorsalis pedis pulses; 2+ left femoral, posterior tibial and dorsalis pedis pulses; no subclavian or femoral bruits Neurological: grossly nonfocal Psych: Normal mood and affect    ASSESSMENT:    1. PAF (paroxysmal atrial fibrillation) (HCC)   2. Acquired thrombophilia (HCC)     PLAN:    In order of problems listed above:  1. PAF: CHA2DS2-VASc 3 (age and gender).  Arrhythmic episodes are infrequent only minimally symptomatic and brief.  She is on anticoagulation and has not had any bleeding complaints.  I do not think she requires antiarrhythmics or ablation.  Continue with as needed beta-blocker, but can consider changing  it to a daily medication if her symptom frequency increases.. 2.  Anticoagulation: Other than easy bruising, no complications.  Medication Adjustments/Labs and Tests Ordered: Current medicines are reviewed at length with the patient today.  Concerns regarding medicines are outlined above. Labs and tests ordered and medication changes are outlined in the patient instructions below:  Patient Instructions  Medication Instructions:  No changes *If you need a refill on your cardiac medications before your next appointment, please call your pharmacy*   Lab Work: None ordered If you have labs (blood work) drawn today and your tests are completely normal, you will receive your results only by: [place] (if you have MyChart) OR A paper copy in the mail If you have any lab test that is abnormal or we need to change your treatment, we will call you to review the results.   Testing/Procedures: None ordered   Follow-Up: At [facility], you and your health needs are our priority.  As part of our continuing mission to provide you with exceptional heart care, we have created designated Provider Care Teams.  These  Care Teams include your primary Cardiologist (physician) and Advanced Practice Providers (APPs -  Physician Assistants and Nurse Practitioners) who all work together to provide you with the care you need, when you need it.  We recommend signing up for the patient portal called "MyChart".  Sign up information is provided on this After Visit Summary.  MyChart is used to connect with patients for Virtual Visits (Telemedicine).  Patients are able to view lab/test results, encounter notes, upcoming appointments, etc.  Non-urgent messages can be sent to your provider as well.   To learn more about what you can do with MyChart, go to [email or web address].    Your next appointment:   12 month(s)  The format for your next appointment:   In Person  Provider:   Sanda Klein, MD {   Important Information About Sugar         Signed, Sanda Klein, MD  11/30/2021 11:47 AM    [place]

## 2021-12-19 ENCOUNTER — Other Ambulatory Visit: Payer: Self-pay | Admitting: *Deleted

## 2021-12-19 MED ORDER — METOPROLOL TARTRATE 25 MG PO TABS: 12.5000 mg | ORAL_TABLET | Freq: Every day | ORAL | 3 refills | Status: DC | PRN

## 2022-02-21 DIAGNOSIS — R051 Acute cough: Secondary | ICD-10-CM | POA: Diagnosis not present

## 2022-02-27 DIAGNOSIS — D485 Neoplasm of uncertain behavior of skin: Secondary | ICD-10-CM | POA: Diagnosis not present

## 2022-02-27 DIAGNOSIS — C44319 Basal cell carcinoma of skin of other parts of face: Secondary | ICD-10-CM | POA: Diagnosis not present

## 2022-02-27 DIAGNOSIS — L821 Other seborrheic keratosis: Secondary | ICD-10-CM | POA: Diagnosis not present

## 2022-02-27 DIAGNOSIS — Z85828 Personal history of other malignant neoplasm of skin: Secondary | ICD-10-CM | POA: Diagnosis not present

## 2022-03-01 DIAGNOSIS — H401131 Primary open-angle glaucoma, bilateral, mild stage: Secondary | ICD-10-CM | POA: Diagnosis not present

## 2022-03-02 ENCOUNTER — Other Ambulatory Visit: Payer: Self-pay | Admitting: Cardiovascular Disease

## 2022-03-02 MED ORDER — BENZONATATE 100 MG PO CAPS: 100.0000 mg | ORAL_CAPSULE | Freq: Three times a day (TID) | ORAL | 0 refills | Status: DC | PRN

## 2022-03-02 NOTE — Progress Notes (Signed)
Persistent post-viral cough

## 2022-03-19 ENCOUNTER — Encounter: Payer: Self-pay | Admitting: Cardiovascular Disease

## 2022-03-19 ENCOUNTER — Other Ambulatory Visit: Payer: Self-pay | Admitting: Cardiovascular Disease

## 2022-03-19 DIAGNOSIS — I480 Paroxysmal atrial fibrillation: Secondary | ICD-10-CM

## 2022-03-19 NOTE — Telephone Encounter (Signed)
Prescription refill request for Eliquis received. Indication:afib Last office visit:8/23 Scr:0.7 Age: 81 Weight:63.7  kg  Prescription refilled

## 2022-04-03 DIAGNOSIS — C44319 Basal cell carcinoma of skin of other parts of face: Secondary | ICD-10-CM | POA: Diagnosis not present

## 2022-04-03 DIAGNOSIS — Z85828 Personal history of other malignant neoplasm of skin: Secondary | ICD-10-CM | POA: Diagnosis not present

## 2022-04-17 DIAGNOSIS — Z1231 Encounter for screening mammogram for malignant neoplasm of breast: Secondary | ICD-10-CM | POA: Diagnosis not present

## 2022-04-24 DIAGNOSIS — R053 Chronic cough: Secondary | ICD-10-CM | POA: Diagnosis not present

## 2022-04-26 DIAGNOSIS — Z85828 Personal history of other malignant neoplasm of skin: Secondary | ICD-10-CM | POA: Diagnosis not present

## 2022-04-26 DIAGNOSIS — L814 Other melanin hyperpigmentation: Secondary | ICD-10-CM | POA: Diagnosis not present

## 2022-04-26 DIAGNOSIS — D485 Neoplasm of uncertain behavior of skin: Secondary | ICD-10-CM | POA: Diagnosis not present

## 2022-04-26 DIAGNOSIS — L738 Other specified follicular disorders: Secondary | ICD-10-CM | POA: Diagnosis not present

## 2022-04-26 DIAGNOSIS — C4441 Basal cell carcinoma of skin of scalp and neck: Secondary | ICD-10-CM | POA: Diagnosis not present

## 2022-04-26 DIAGNOSIS — L820 Inflamed seborrheic keratosis: Secondary | ICD-10-CM | POA: Diagnosis not present

## 2022-04-26 DIAGNOSIS — L720 Epidermal cyst: Secondary | ICD-10-CM | POA: Diagnosis not present

## 2022-04-26 DIAGNOSIS — D2261 Melanocytic nevi of right upper limb, including shoulder: Secondary | ICD-10-CM | POA: Diagnosis not present

## 2022-04-26 DIAGNOSIS — L821 Other seborrheic keratosis: Secondary | ICD-10-CM | POA: Diagnosis not present

## 2022-05-03 DIAGNOSIS — C4441 Basal cell carcinoma of skin of scalp and neck: Secondary | ICD-10-CM | POA: Diagnosis not present

## 2022-06-10 ENCOUNTER — Other Ambulatory Visit: Payer: Self-pay | Admitting: Cardiovascular Disease

## 2022-07-03 DIAGNOSIS — H401131 Primary open-angle glaucoma, bilateral, mild stage: Secondary | ICD-10-CM | POA: Diagnosis not present

## 2022-07-31 DIAGNOSIS — M533 Sacrococcygeal disorders, not elsewhere classified: Secondary | ICD-10-CM | POA: Diagnosis not present

## 2022-08-01 DIAGNOSIS — M533 Sacrococcygeal disorders, not elsewhere classified: Secondary | ICD-10-CM | POA: Diagnosis not present

## 2022-08-20 DIAGNOSIS — M533 Sacrococcygeal disorders, not elsewhere classified: Secondary | ICD-10-CM | POA: Diagnosis not present

## 2022-08-23 ENCOUNTER — Other Ambulatory Visit: Payer: Self-pay | Admitting: Cardiovascular Disease

## 2022-08-23 DIAGNOSIS — I480 Paroxysmal atrial fibrillation: Secondary | ICD-10-CM

## 2022-08-23 NOTE — Telephone Encounter (Signed)
Prescription refill request for Eliquis received. Indication: PAF Last office visit: 11/30/21  Judie Petit Croitoru MD Scr: 0.77 on 11/27/21  KPN Age: 82 Weight: 63.7kg  Based on above findings Eliquis 5mg  twice daily is then appropriate dose.  Refill approved.

## 2022-08-27 ENCOUNTER — Encounter: Payer: Self-pay | Admitting: Cardiovascular Disease

## 2022-09-24 DIAGNOSIS — M545 Low back pain, unspecified: Secondary | ICD-10-CM | POA: Diagnosis not present

## 2022-09-24 DIAGNOSIS — M25552 Pain in left hip: Secondary | ICD-10-CM | POA: Diagnosis not present

## 2022-09-24 DIAGNOSIS — R269 Unspecified abnormalities of gait and mobility: Secondary | ICD-10-CM | POA: Diagnosis not present

## 2022-09-24 DIAGNOSIS — M25551 Pain in right hip: Secondary | ICD-10-CM | POA: Diagnosis not present

## 2022-10-22 DIAGNOSIS — M25552 Pain in left hip: Secondary | ICD-10-CM | POA: Diagnosis not present

## 2022-10-22 DIAGNOSIS — M545 Low back pain, unspecified: Secondary | ICD-10-CM | POA: Diagnosis not present

## 2022-10-22 DIAGNOSIS — M25551 Pain in right hip: Secondary | ICD-10-CM | POA: Diagnosis not present

## 2022-10-22 DIAGNOSIS — R269 Unspecified abnormalities of gait and mobility: Secondary | ICD-10-CM | POA: Diagnosis not present

## 2022-10-24 DIAGNOSIS — E538 Deficiency of other specified B group vitamins: Secondary | ICD-10-CM | POA: Diagnosis not present

## 2022-10-24 DIAGNOSIS — Z5181 Encounter for therapeutic drug level monitoring: Secondary | ICD-10-CM | POA: Diagnosis not present

## 2022-10-24 DIAGNOSIS — I480 Paroxysmal atrial fibrillation: Secondary | ICD-10-CM | POA: Diagnosis not present

## 2022-10-24 DIAGNOSIS — R058 Other specified cough: Secondary | ICD-10-CM | POA: Diagnosis not present

## 2022-10-24 DIAGNOSIS — E559 Vitamin D deficiency, unspecified: Secondary | ICD-10-CM | POA: Diagnosis not present

## 2022-10-24 DIAGNOSIS — E7800 Pure hypercholesterolemia, unspecified: Secondary | ICD-10-CM | POA: Diagnosis not present

## 2022-10-24 DIAGNOSIS — D6869 Other thrombophilia: Secondary | ICD-10-CM | POA: Diagnosis not present

## 2022-10-24 DIAGNOSIS — Z0000 Encounter for general adult medical examination without abnormal findings: Secondary | ICD-10-CM | POA: Diagnosis not present

## 2022-11-06 DIAGNOSIS — H401131 Primary open-angle glaucoma, bilateral, mild stage: Secondary | ICD-10-CM | POA: Diagnosis not present

## 2022-11-07 DIAGNOSIS — Z85828 Personal history of other malignant neoplasm of skin: Secondary | ICD-10-CM | POA: Diagnosis not present

## 2022-11-07 DIAGNOSIS — L821 Other seborrheic keratosis: Secondary | ICD-10-CM | POA: Diagnosis not present

## 2023-01-29 ENCOUNTER — Emergency Department (HOSPITAL_COMMUNITY): Payer: Medicare PPO

## 2023-01-29 ENCOUNTER — Emergency Department (HOSPITAL_COMMUNITY)
Admission: EM | Admit: 2023-01-29 | Discharge: 2023-01-29 | Disposition: A | Discharge status: Home / Self Care | Payer: Medicare PPO | Attending: Emergency Medicine | Admitting: Emergency Medicine

## 2023-01-29 ENCOUNTER — Other Ambulatory Visit: Payer: Self-pay

## 2023-01-29 DIAGNOSIS — I4891 Unspecified atrial fibrillation: Secondary | ICD-10-CM | POA: Insufficient documentation

## 2023-01-29 DIAGNOSIS — M11261 Other chondrocalcinosis, right knee: Secondary | ICD-10-CM | POA: Diagnosis not present

## 2023-01-29 DIAGNOSIS — S8991XA Unspecified injury of right lower leg, initial encounter: Secondary | ICD-10-CM | POA: Diagnosis not present

## 2023-01-29 DIAGNOSIS — S0083XA Contusion of other part of head, initial encounter: Secondary | ICD-10-CM | POA: Diagnosis not present

## 2023-01-29 DIAGNOSIS — S0993XA Unspecified injury of face, initial encounter: Secondary | ICD-10-CM | POA: Diagnosis not present

## 2023-01-29 DIAGNOSIS — S0990XA Unspecified injury of head, initial encounter: Secondary | ICD-10-CM | POA: Diagnosis not present

## 2023-01-29 DIAGNOSIS — Y92480 Sidewalk as the place of occurrence of the external cause: Secondary | ICD-10-CM | POA: Insufficient documentation

## 2023-01-29 DIAGNOSIS — W010XXA Fall on same level from slipping, tripping and stumbling without subsequent striking against object, initial encounter: Secondary | ICD-10-CM | POA: Insufficient documentation

## 2023-01-29 DIAGNOSIS — S199XXA Unspecified injury of neck, initial encounter: Secondary | ICD-10-CM | POA: Diagnosis not present

## 2023-01-29 DIAGNOSIS — M419 Scoliosis, unspecified: Secondary | ICD-10-CM | POA: Diagnosis not present

## 2023-01-29 DIAGNOSIS — S060X0A Concussion without loss of consciousness, initial encounter: Secondary | ICD-10-CM | POA: Diagnosis not present

## 2023-01-29 DIAGNOSIS — M85861 Other specified disorders of bone density and structure, right lower leg: Secondary | ICD-10-CM | POA: Diagnosis not present

## 2023-01-29 DIAGNOSIS — Z7901 Long term (current) use of anticoagulants: Secondary | ICD-10-CM | POA: Diagnosis not present

## 2023-01-29 DIAGNOSIS — S299XXA Unspecified injury of thorax, initial encounter: Secondary | ICD-10-CM | POA: Diagnosis not present

## 2023-01-29 DIAGNOSIS — M4802 Spinal stenosis, cervical region: Secondary | ICD-10-CM | POA: Diagnosis not present

## 2023-01-29 DIAGNOSIS — M1711 Unilateral primary osteoarthritis, right knee: Secondary | ICD-10-CM | POA: Diagnosis not present

## 2023-01-29 DIAGNOSIS — M47812 Spondylosis without myelopathy or radiculopathy, cervical region: Secondary | ICD-10-CM | POA: Diagnosis not present

## 2023-01-29 LAB — COMPREHENSIVE METABOLIC PANEL
ALT: 12 U/L (ref 0–44)
AST: 21 U/L (ref 15–41)
Albumin: 3.8 g/dL (ref 3.5–5.0)
Alkaline Phosphatase: 68 U/L (ref 38–126)
Anion gap: 10 (ref 5–15)
BUN: 19 mg/dL (ref 8–23)
CO2: 24 mmol/L (ref 22–32)
Calcium: 9.6 mg/dL (ref 8.9–10.3)
Chloride: 105 mmol/L (ref 98–111)
Creatinine, Ser: 0.78 mg/dL (ref 0.44–1.00)
GFR, Estimated: >60 mL/min (ref >60)
Glucose, Bld: 127 mg/dL — ABNORMAL HIGH (ref 70–99)
Potassium: 3.6 mmol/L (ref 3.5–5.1)
Sodium: 139 mmol/L (ref 135–145)
Total Bilirubin: 0.8 mg/dL (ref 0.3–1.2)
Total Protein: 6.3 g/dL — ABNORMAL LOW (ref 6.5–8.1)

## 2023-01-29 LAB — I-STAT CHEM 8, ED
BUN: 19 mg/dL (ref 8–23)
Calcium, Ion: 1.23 mmol/L (ref 1.15–1.40)
Chloride: 103 mmol/L (ref 98–111)
Creatinine, Ser: 0.8 mg/dL (ref 0.44–1.00)
Glucose, Bld: 128 mg/dL — ABNORMAL HIGH (ref 70–99)
HCT: 42 % (ref 36.0–46.0)
Hemoglobin: 14.3 g/dL (ref 12.0–15.0)
Potassium: 3.5 mmol/L (ref 3.5–5.1)
Sodium: 140 mmol/L (ref 135–145)
TCO2: 24 mmol/L (ref 22–32)

## 2023-01-29 LAB — CBC
HCT: 42.5 % (ref 36.0–46.0)
Hemoglobin: 14.1 g/dL (ref 12.0–15.0)
MCH: 30.5 pg (ref 26.0–34.0)
MCHC: 33.2 g/dL (ref 30.0–36.0)
MCV: 91.8 fL (ref 80.0–100.0)
Platelets: 261 10*3/uL (ref 150–400)
RBC: 4.63 MIL/uL (ref 3.87–5.11)
RDW: 12.3 % (ref 11.5–15.5)
WBC: 6.6 10*3/uL (ref 4.0–10.5)
nRBC: 0 % (ref 0.0–0.2)

## 2023-01-29 MED ORDER — BACITRACIN ZINC 500 UNIT/GM EX OINT
TOPICAL_OINTMENT | Freq: Once | CUTANEOUS | Status: AC
  Administered 2023-01-29: 1 via TOPICAL
  Filled 2023-01-29: qty 0.9

## 2023-01-29 MED ORDER — ACETAMINOPHEN 325 MG PO TABS
650.0000 mg | ORAL_TABLET | Freq: Once | ORAL | Status: AC
  Administered 2023-01-29: 650 mg via ORAL
  Filled 2023-01-29: qty 2

## 2023-01-29 NOTE — ED Notes (Signed)
Patient transported to CT with this RN 

## 2023-01-29 NOTE — ED Provider Notes (Signed)
Amber Mccarthy EMERGENCY DEPARTMENT AT [facility] Provider Note   CSN: [id number] Arrival date & time: 01/29/23  1921     History  Chief Complaint  Patient presents with   Amber Mccarthy is a 82 y.o. female.   Fall     Patient has a history of paroxysmal atrial fibrillation hypokalemia.  She presents to the ED for evaluation after fall.  Patient husband states she tripped over uneven sidewalk.  Patient ended up falling striking her chin on the ground.  Patient has had some repetitive questions since the fall.  Husband noted abrasion to her chin and right knee.  Patient denies any chest pain or shortness of breath.  No hip or pelvis pain.  No numbness or weakness  Home Medications Prior to Admission medications   Medication Sig Start Date End Date Taking? Authorizing Provider  acetaminophen (TYLENOL) 500 MG tablet Take 500-1,000 mg by mouth every 6 (six) hours as needed for mild pain or moderate pain.   Yes [provider]  ascorbic acid (VITAMIN C) 500 MG tablet Take 500 mg by mouth 2 (two) times daily.   Yes [provider]  cholecalciferol (VITAMIN D3) 25 MCG (1000 UNIT) tablet Take 1,000 Units by mouth daily.   Yes [provider]  dorzolamide-timolol (COSOPT) 22.3-6.8 MG/ML ophthalmic solution Place 1 drop into both eyes 2 (two) times daily. 05/22/19  Yes [provider]  ELIQUIS 5 MG TABS tablet TAKE 1 TABLET BY MOUTH TWICE A DAY 08/23/22  Yes Croitoru, Mihai, MD  latanoprost (XALATAN) 0.005 % ophthalmic solution Place 1 drop into both eyes at bedtime. 04/30/19  Yes [provider]  metoprolol tartrate (LOPRESSOR) 25 MG tablet TAKE 0.5 TABLETS (12.5 MG TOTAL) BY MOUTH DAILY AS NEEDED (FOR PALPITATIONS). 06/11/22  Yes Croitoru, Mihai, MD  omeprazole (PRILOSEC) 20 MG capsule Take 20 mg by mouth daily.   Yes [provider]  pravastatin (PRAVACHOL) 20 MG tablet Take 20 mg by mouth 2 (two) times a week.   Yes  [provider]  vitamin B-12 (CYANOCOBALAMIN) 1000 MCG tablet Take 1,000 mcg by mouth daily.   Yes [provider]      Allergies    Patient has no known allergies.    Review of Systems   Review of Systems  Physical Exam Updated Vital Signs BP 118/65   Pulse 73   Temp 97.8 F (36.6 C) (Oral)   Resp 20   Ht 1.651 m (5\' 5" )   Wt 61.2 kg   SpO2 97%   BMI 22.47 kg/m  Physical Exam Vitals and nursing note reviewed.  Constitutional:      General: She is not in acute distress.    Appearance: She is well-developed.  HENT:     Head: Normocephalic.     Comments: Abrasion noted to the chin    Right Ear: External ear normal.     Left Ear: External ear normal.  Eyes:     General: No scleral icterus.       Right eye: No discharge.        Left eye: No discharge.     Conjunctiva/sclera: Conjunctivae normal.  Neck:     Trachea: No tracheal deviation.  Cardiovascular:     Rate and Rhythm: Normal rate and regular rhythm.  Pulmonary:     Effort: Pulmonary effort is normal. No respiratory distress.     Breath sounds: Normal breath sounds. No stridor. No wheezing or rales.  Abdominal:     General: Bowel sounds are normal. There is no distension.     Palpations: Abdomen is soft.     Tenderness: There is no abdominal tenderness. There is no guarding or rebound.  Musculoskeletal:        General: Tenderness present. No deformity.     Cervical back: Normal and neck supple.     Thoracic back: Normal.     Lumbar back: Normal.     Comments: Mild tenderness palpation right knee, abrasion noted, no tenderness palpation bilateral upper extremities or lower extremities, full range of motion  Skin:    General: Skin is warm and dry.     Findings: No rash.  Neurological:     General: No focal deficit present.     Mental Status: She is alert.     Cranial Nerves: No cranial nerve deficit, dysarthria or facial asymmetry.     Sensory: No sensory deficit.     Motor: No abnormal  muscle tone or seizure activity.     Coordination: Coordination normal.     Comments: Answering questions appropriately although some repetitive questioning  Psychiatric:        Mood and Affect: Mood normal.     ED Results / Procedures / Treatments   Labs (all labs ordered are listed, but only abnormal results are displayed) Labs Reviewed  COMPREHENSIVE METABOLIC PANEL - Abnormal; Notable for the following components:      Result Value   Glucose, Bld 127 (*)    Total Protein 6.3 (*)    All other components within normal limits  I-STAT CHEM 8, ED - Abnormal; Notable for the following components:   Glucose, Bld 128 (*)    All other components within normal limits  CBC    EKG None  Radiology CT MAXILLOFACIAL WO CONTRAST  Result Date: 01/29/2023 CLINICAL DATA:  Tripped and fell with trauma to the face. EXAM: CT MAXILLOFACIAL WITHOUT CONTRAST TECHNIQUE: Multidetector CT imaging of the maxillofacial structures was performed. Multiplanar CT image reconstructions were also generated. RADIATION DOSE REDUCTION: This exam was performed according to the departmental dose-optimization program which includes automated exposure control, adjustment of the mA and/or kV according to patient size and/or use of iterative reconstruction technique. COMPARISON:  None Available. FINDINGS: Osseous: No regional fracture. Orbits: No evidence of soft tissue orbital injury. Sinuses: No inflammatory or traumatic fluid in the sinuses. Soft tissues: No appreciable soft tissue injury by CT. Limited intracranial: Negative. IMPRESSION: No regional fracture. No evidence of soft tissue orbital injury. Electronically Signed   By: Paulina Fusi M.D.   On: 01/29/2023 20:25   CT CERVICAL SPINE WO CONTRAST  Result Date: 01/29/2023 CLINICAL DATA:  Fall with trauma to the head, face and neck EXAM: CT CERVICAL SPINE WITHOUT CONTRAST TECHNIQUE: Multidetector CT imaging of the cervical spine was performed without intravenous  contrast. Multiplanar CT image reconstructions were also generated. RADIATION DOSE REDUCTION: This exam was performed according to the departmental dose-optimization program which includes automated exposure control, adjustment of the mA and/or kV according to patient size and/or use of iterative reconstruction technique. COMPARISON:  None Available. FINDINGS: Alignment: No traumatic malalignment.  Mild scoliosis. Skull base and vertebrae: No regional fracture. No focal bone lesion. Soft tissues and spinal canal: No traumatic soft tissue finding. Disc levels: Chronic degenerative spondylosis from C3-4 through C6-7. Chronic fusion of the facet joints on the left at C2-3. No bony canal stenosis. Mild bony foraminal narrowing on the right at C5-6 and C6-7 and  on the left at C6-7. Upper chest: Negative Other: None IMPRESSION: No acute or traumatic finding. Chronic degenerative spondylosis. Electronically Signed   By: Paulina Fusi M.D.   On: 01/29/2023 20:23   CT HEAD WO CONTRAST  Result Date: 01/29/2023 CLINICAL DATA:  Fall with trauma to the head, face and neck. EXAM: CT HEAD WITHOUT CONTRAST TECHNIQUE: Contiguous axial images were obtained from the base of the skull through the vertex without intravenous contrast. RADIATION DOSE REDUCTION: This exam was performed according to the departmental dose-optimization program which includes automated exposure control, adjustment of the mA and/or kV according to patient size and/or use of iterative reconstruction technique. COMPARISON:  02/09/2010 FINDINGS: Brain: Age related volume loss. No evidence of old or acute focal infarction, mass lesion, hemorrhage, hydrocephalus or extra-axial collection. Vascular: No abnormal vascular finding. Skull: Normal Sinuses/Orbits: Clear/normal Other: None IMPRESSION: No acute or traumatic finding. Age related volume loss. Electronically Signed   By: Paulina Fusi M.D.   On: 01/29/2023 20:22   DG Knee Right Port  Result Date:  01/29/2023 CLINICAL DATA:  One trauma to the right knee. EXAM: PORTABLE RIGHT KNEE - 1-2 VIEW COMPARISON:  Right knee radiograph dated 06/07/2018 FINDINGS: No acute fracture or dislocation. The bones are osteopenic. Mild arthritic changes of the knee with meniscal chondrocalcinosis. No joint effusion. The soft tissues are unremarkable. IMPRESSION: 1. No acute fracture or dislocation. 2. Mild arthritic changes. Electronically Signed   By: Elgie Collard M.D.   On: 01/29/2023 20:08   DG Chest Port 1 View  Result Date: 01/29/2023 CLINICAL DATA:  Fall and trauma to the head. EXAM: PORTABLE CHEST 1 VIEW COMPARISON:  Chest radiograph dated 06/16/2018 FINDINGS: Left lung base linear atelectasis/scarring. No focal consolidation, pleural effusion, or pneumothorax. The cardiac silhouette is within normal limits. No acute osseous pathology. IMPRESSION: No active disease. Electronically Signed   By: Elgie Collard M.D.   On: 01/29/2023 20:07    Procedures Procedures    Medications Ordered in ED Medications  bacitracin ointment (has no administration in time range)  acetaminophen (TYLENOL) tablet 650 mg (has no administration in time range)    ED Course/ Medical Decision Making/ A&P Clinical Course as of 01/29/23 2105  Tue Jan 29, 2023  2037 CBC normal.  I-STAT normal [JK]  2038 Facial CT without acute abnormalities [JK]  2039 Head and C-spine CT without acute abnormalities [JK]  2039 Knee x-ray without acute findings [JK]  2039 Chest x-ray without acute findings [JK]    Clinical Course User Index [JK] Linwood Dibbles, MD                                 Medical Decision Making Differential diagnosis includes but not limited to concussion, cerebral hemorrhage, facial bone fracture, knee contusion, patellar fracture  Problems Addressed: Concussion without loss of consciousness, initial encounter: acute illness or injury that poses a threat to life or bodily functions Contusion of face, initial  encounter: acute illness or injury that poses a threat to life or bodily functions  Amount and/or Complexity of Data Reviewed Labs: ordered. Decision-making details documented in ED Course. Radiology: ordered and independent interpretation performed.  Risk OTC drugs.   Patient presented to the ED for evaluation after a fall.  Patient at risk for serious injury as she is on anticoagulation.  Patient noted to have some repetitive questioning.  CT scan fortunately did not show any signs of serious injury.  No signs of fracture or cerebral hemorrhage.  Plain films did not show any signs of fracture or dislocation.  Local wound care provided.  Patient's symptoms are slowly improving.  Family is here and will be able to monitor at home.  I will have her hold her Eliquis dose this evening.  Otherwise can resume in the morning.  Tylenol for pain.  Warning signs and precautions discussed.        Final Clinical Impression(s) / ED Diagnoses Final diagnoses:  Contusion of face, initial encounter  Concussion without loss of consciousness, initial encounter    Rx / DC Orders ED Discharge Orders     None         Linwood Dibbles, MD 01/29/23 2105

## 2023-01-29 NOTE — Discharge Instructions (Signed)
Hold your Eliquis dose this evening.  Take over-the-counter medications such as Tylenol to help with your aches and pains.  Apply antibiotic ointment to your abrasions.  Return to the emergency room for severe headache vomiting confusion or other concerns

## 2023-01-29 NOTE — ED Triage Notes (Addendum)
Patient came in from triage. Pt was walking in her neighborhood with her husband and tripped and fell on the sidewalk. Her husband reports she hit her head, knees, and chin. Initially she was c/o of dizziness. Pt is on eliquis for hx of Afib. Pt is asking repetitive questions. Denies any pain, headache, or dizziness. GCS 15

## 2023-02-08 DIAGNOSIS — M545 Low back pain, unspecified: Secondary | ICD-10-CM | POA: Diagnosis not present

## 2023-02-08 DIAGNOSIS — M25552 Pain in left hip: Secondary | ICD-10-CM | POA: Diagnosis not present

## 2023-02-08 DIAGNOSIS — R269 Unspecified abnormalities of gait and mobility: Secondary | ICD-10-CM | POA: Diagnosis not present

## 2023-02-08 DIAGNOSIS — M25551 Pain in right hip: Secondary | ICD-10-CM | POA: Diagnosis not present

## 2023-03-24 ENCOUNTER — Encounter: Payer: Self-pay | Admitting: Cardiovascular Disease

## 2023-03-27 ENCOUNTER — Ambulatory Visit: Payer: Medicare PPO | Attending: Cardiovascular Disease | Admitting: Cardiovascular Disease

## 2023-03-27 ENCOUNTER — Ambulatory Visit: Payer: Medicare PPO | Admitting: Cardiovascular Disease

## 2023-03-27 ENCOUNTER — Encounter: Payer: Self-pay | Admitting: Cardiovascular Disease

## 2023-03-27 VITALS — BP 110/80 | HR 74 | Ht 65.0 in | Wt 135.8 lb

## 2023-03-27 DIAGNOSIS — D6869 Other thrombophilia: Secondary | ICD-10-CM

## 2023-03-27 DIAGNOSIS — I480 Paroxysmal atrial fibrillation: Secondary | ICD-10-CM | POA: Diagnosis not present

## 2023-03-27 NOTE — Progress Notes (Signed)
Cardiology Office Note:    Date:  03/27/2023   ID:  Amber Mccarthy, DOB 1941/04/14, MRN [id number]  PCP:  Shon Hale, MD  Cardiologist:  Thurmon Fair, MD    Referring MD: Shon Hale, *   Chief complaint: atrial fibrillation  History of Present Illness:    Amber Mccarthy is a 82 y.o. female with a hx of infrequent episodes of paroxysmal atrial fibrillation.  She required cardioversion in 2020, but subsequent events have resolved spontaneously.  She is very attuned to her arrhythmia, both based on her clinical complaints and her smart watch.  She has had 4 episodes of atrial fibrillation this year lasting between 2 and 5 hours, all of them resolving spontaneously.  On each occasion she had RVR in the 140s, that gradually improved after she took metoprolol tartrate.  She feels unwell during atrial fibrillation but does not really have severe shortness of breath, chest pain or near syncope/syncope.  She continues to exercise daily, walking at least a mile and exercises with a personal trainer as well.  She has not had serious bleeding problems but did have 1 mechanical fall that did lead to head injury.  She had a CT that did not show evidence of bleeding.  She has had mild occasional tension headaches since then that resolved promptly with acetaminophen.  Her only risk factors for embolic events are age and gender. She does not have vascular disease, hypertension diabetes, history of heart failure or stroke/TIA. Echo in 2016 showed normal left ventricular ejection fraction with EF 65-70% in the left atrium was described as normal in size.   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  Her husband Amber Mccarthy is also my patient.   Past Medical History:  Diagnosis Date   Colon polyps    PAF (paroxysmal atrial fibrillation) (HCC) 09/2014   a.  Echo 6/16:  Vigorous LVF, EF 65-70%, no RWMA, Gr 1 DD    Past Surgical History:   Procedure Laterality Date   CESAREAN SECTION  1987    Current Medications: Current Meds  Medication Sig   acetaminophen (TYLENOL) 500 MG tablet Take 500-1,000 mg by mouth every 6 (six) hours as needed for mild pain or moderate pain.   ascorbic acid (VITAMIN C) 500 MG tablet Take 500 mg by mouth 2 (two) times daily.   cholecalciferol (VITAMIN D3) 25 MCG (1000 UNIT) tablet Take 1,000 Units by mouth daily.   dorzolamide-timolol (COSOPT) 22.3-6.8 MG/ML ophthalmic solution Place 1 drop into both eyes 2 (two) times daily.   ELIQUIS 5 MG TABS tablet TAKE 1 TABLET BY MOUTH TWICE A DAY   latanoprost (XALATAN) 0.005 % ophthalmic solution Place 1 drop into both eyes at bedtime.   metoprolol tartrate (LOPRESSOR) 25 MG tablet TAKE 0.5 TABLETS (12.5 MG TOTAL) BY MOUTH DAILY AS NEEDED (FOR PALPITATIONS).   pravastatin (PRAVACHOL) 20 MG tablet Take 20 mg by mouth 2 (two) times a week.   vitamin B-12 (CYANOCOBALAMIN) 1000 MCG tablet Take 1,000 mcg by mouth daily.     Allergies:   Patient has no known allergies.   Social History   Socioeconomic History   Marital status: Married    Spouse name: Amber Mccarthy   Number of children: 1   Years of education: Not on file   Highest education level: Not on file  Occupational History   Occupation: Doctor, general practice    Comment: Retired from [employer]  Smoking status: Former    Current packs/day: 0.00    Average packs/day: 1 pack/day for 5.0 years (5.0 ttl pk-yrs)    Types: Cigarettes    Start date: 82    Quit date: 1970    Years since quitting: 54.9   Smokeless tobacco: Never   Tobacco comments:    quit 1970s  Substance and Sexual Activity   Alcohol use: Yes    Alcohol/week: 7.0 standard drinks of alcohol    Types: 7 Standard drinks or equivalent per week    Comment: 1 glass of wine per night--> since afib she has decreased   Drug use: No   Sexual activity: Not on file  Other Topics Concern   Not on file  Social History  Narrative   Pt lives in [place] with spouse.  Retired.  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   Social Determinants of Health   Financial Resource Strain: Not on file  Food Insecurity: Not on file  Transportation Needs: Not on file  Physical Activity: Not on file  Stress: Not on file  Social Connections: Not on file     Family History: The patient's family history includes Alzheimer's disease in her father; Cancer in her mother. ROS:   Please see the history of present illness.    All other systems are reviewed and are negative.   EKGs/Labs/Other Studies Reviewed:    The following studies were reviewed today:  EKG:  EKG is ordered today and shows normal sinus rhythm, completely normal tracing. Recent Labs: 01/29/2023: ALT 12; BUN 19; Creatinine, Ser 0.80; Hemoglobin 14.3; Platelets 261; Potassium 3.5; Sodium 140   Recent Lipid Panel No results found for: "CHOL", "TRIG", "HDL", "CHOLHDL", "VLDL", "LDLCALC", "LDLDIRECT"  Physical Exam:    VS:  BP 110/80 (BP Location: Left Arm, Patient Position: Sitting, Cuff Size: Normal)   Pulse 74   Ht 5\' 5"  (1.651 m)   Wt 135 lb 12.8 oz (61.6 kg)   SpO2 94%   BMI 22.60 kg/m     Wt Readings from Last 3 Encounters:  03/27/23 135 lb 12.8 oz (61.6 kg)  01/29/23 135 lb (61.2 kg)  11/30/21 140 lb 6.4 oz (63.7 kg)      General: Alert, oriented x3, no distress, appears lean and fit, younger than stated age Head: no evidence of trauma, PERRL, EOMI, no exophtalmos or lid lag, no myxedema, no xanthelasma; normal ears, nose and oropharynx Neck: normal jugular venous pulsations and no hepatojugular reflux; brisk carotid pulses without delay and no carotid bruits Chest: clear to auscultation, no signs of consolidation by percussion or palpation, normal fremitus, symmetrical and full respiratory excursions Cardiovascular: normal position and quality of the apical impulse, regular rhythm, normal first and  second heart sounds, no murmurs, rubs or gallops Abdomen: no tenderness or distention, no masses by palpation, no abnormal pulsatility or arterial bruits, normal bowel sounds, no hepatosplenomegaly Extremities: no clubbing, cyanosis or edema; 2+ radial, ulnar and brachial pulses bilaterally; 2+ right femoral, posterior tibial and dorsalis pedis pulses; 2+ left femoral, posterior tibial and dorsalis pedis pulses; no subclavian or femoral bruits Neurological: grossly nonfocal Psych: Normal mood and affect    ASSESSMENT:    1. PAF (paroxysmal atrial fibrillation) (HCC)   2. Acquired thrombophilia (HCC)     PLAN:    In order of problems listed above:  1. PAF: CHA2DS2-VASc 3 (age and gender).  The episodes remain infrequent, relatively brief and resolved spontaneously.  I  still do not think it is worth starting rhythm control strategy either with medications or ablation.  If the symptoms become more prevalent and persistent would recommend early referral for ablation rather than medications since she does not have any structural heart disease. 2. Anticoagulation: She has only had 1 fall and thankfully did not have serious bleeding problems.  No complications other than easy bruising.  Briefly discussed the option for a Watchman device, pros/cons and possible complications.  For the time being continued oral anticoagulant remains the best strategy.  Medication Adjustments/Labs and Tests Ordered: Current medicines are reviewed at length with the patient today.  Concerns regarding medicines are outlined above. Labs and tests ordered and medication changes are outlined in the patient instructions below:  Patient Instructions  Medication Instructions:  No changes *If you need a refill on your cardiac medications before your next appointment, please call your pharmacy*  Follow-Up: At [facility], you and your health needs are our priority.  As part of our continuing mission to provide you  with exceptional heart care, we have created designated Provider Care Teams.  These Care Teams include your primary Cardiologist (physician) and Advanced Practice Providers (APPs -  Physician Assistants and Nurse Practitioners) who all work together to provide you with the care you need, when you need it.  We recommend signing up for the patient portal called "MyChart".  Sign up information is provided on this After Visit Summary.  MyChart is used to connect with patients for Virtual Visits (Telemedicine).  Patients are able to view lab/test results, encounter notes, upcoming appointments, etc.  Non-urgent messages can be sent to your provider as well.   To learn more about what you can do with MyChart, go to [email or web address].    Your next appointment:   1 year(s)  Provider:   Thurmon Fair, MD       Signed, Thurmon Fair, MD  03/27/2023 2:37 PM    [facility]

## 2023-03-27 NOTE — Patient Instructions (Signed)

## 2023-04-08 ENCOUNTER — Other Ambulatory Visit: Payer: Self-pay | Admitting: Cardiovascular Disease

## 2023-04-08 DIAGNOSIS — I480 Paroxysmal atrial fibrillation: Secondary | ICD-10-CM

## 2023-04-08 NOTE — Telephone Encounter (Signed)
Prescription refill request for Eliquis received. Indication:afib Last office visit:12/24 Scr:0.80  10/24 Age: 82 Weight:61.6  kg  Prescription refilled

## 2023-04-23 DIAGNOSIS — Z1231 Encounter for screening mammogram for malignant neoplasm of breast: Secondary | ICD-10-CM | POA: Diagnosis not present

## 2023-05-02 DIAGNOSIS — S060X1D Concussion with loss of consciousness of 30 minutes or less, subsequent encounter: Secondary | ICD-10-CM | POA: Diagnosis not present

## 2023-05-02 DIAGNOSIS — E7800 Pure hypercholesterolemia, unspecified: Secondary | ICD-10-CM | POA: Diagnosis not present

## 2023-05-02 DIAGNOSIS — I480 Paroxysmal atrial fibrillation: Secondary | ICD-10-CM | POA: Diagnosis not present

## 2023-05-02 DIAGNOSIS — E559 Vitamin D deficiency, unspecified: Secondary | ICD-10-CM | POA: Diagnosis not present

## 2023-05-02 DIAGNOSIS — R7309 Other abnormal glucose: Secondary | ICD-10-CM | POA: Diagnosis not present

## 2023-05-02 DIAGNOSIS — K219 Gastro-esophageal reflux disease without esophagitis: Secondary | ICD-10-CM | POA: Diagnosis not present

## 2023-05-21 DIAGNOSIS — H26492 Other secondary cataract, left eye: Secondary | ICD-10-CM | POA: Diagnosis not present

## 2023-05-21 DIAGNOSIS — H401131 Primary open-angle glaucoma, bilateral, mild stage: Secondary | ICD-10-CM | POA: Diagnosis not present

## 2023-06-11 DIAGNOSIS — Z85828 Personal history of other malignant neoplasm of skin: Secondary | ICD-10-CM | POA: Diagnosis not present

## 2023-06-11 DIAGNOSIS — L821 Other seborrheic keratosis: Secondary | ICD-10-CM | POA: Diagnosis not present

## 2023-06-11 DIAGNOSIS — D1801 Hemangioma of skin and subcutaneous tissue: Secondary | ICD-10-CM | POA: Diagnosis not present

## 2023-06-11 DIAGNOSIS — L814 Other melanin hyperpigmentation: Secondary | ICD-10-CM | POA: Diagnosis not present

## 2023-06-11 DIAGNOSIS — L720 Epidermal cyst: Secondary | ICD-10-CM | POA: Diagnosis not present

## 2023-07-22 DIAGNOSIS — U071 COVID-19: Secondary | ICD-10-CM | POA: Diagnosis not present

## 2023-08-13 DIAGNOSIS — M533 Sacrococcygeal disorders, not elsewhere classified: Secondary | ICD-10-CM | POA: Diagnosis not present

## 2023-09-17 DIAGNOSIS — H04123 Dry eye syndrome of bilateral lacrimal glands: Secondary | ICD-10-CM | POA: Diagnosis not present

## 2023-09-17 DIAGNOSIS — H26492 Other secondary cataract, left eye: Secondary | ICD-10-CM | POA: Diagnosis not present

## 2023-09-17 DIAGNOSIS — H401131 Primary open-angle glaucoma, bilateral, mild stage: Secondary | ICD-10-CM | POA: Diagnosis not present

## 2023-09-19 DIAGNOSIS — M533 Sacrococcygeal disorders, not elsewhere classified: Secondary | ICD-10-CM | POA: Diagnosis not present

## 2023-09-24 ENCOUNTER — Other Ambulatory Visit: Payer: Self-pay | Admitting: Cardiovascular Disease

## 2023-09-24 DIAGNOSIS — I480 Paroxysmal atrial fibrillation: Secondary | ICD-10-CM

## 2023-09-25 DIAGNOSIS — M51369 Other intervertebral disc degeneration, lumbar region without mention of lumbar back pain or lower extremity pain: Secondary | ICD-10-CM | POA: Insufficient documentation

## 2023-09-25 DIAGNOSIS — K52832 Lymphocytic colitis: Secondary | ICD-10-CM | POA: Insufficient documentation

## 2023-09-25 DIAGNOSIS — D6869 Other thrombophilia: Secondary | ICD-10-CM | POA: Insufficient documentation

## 2023-09-25 DIAGNOSIS — K2100 Gastro-esophageal reflux disease with esophagitis, without bleeding: Secondary | ICD-10-CM | POA: Insufficient documentation

## 2023-09-25 DIAGNOSIS — Z860100 Personal history of colon polyps, unspecified: Secondary | ICD-10-CM | POA: Insufficient documentation

## 2023-09-25 DIAGNOSIS — E162 Hypoglycemia, unspecified: Secondary | ICD-10-CM | POA: Insufficient documentation

## 2023-09-25 DIAGNOSIS — G4700 Insomnia, unspecified: Secondary | ICD-10-CM | POA: Insufficient documentation

## 2023-09-25 DIAGNOSIS — E7800 Pure hypercholesterolemia, unspecified: Secondary | ICD-10-CM | POA: Insufficient documentation

## 2023-09-25 DIAGNOSIS — K5730 Diverticulosis of large intestine without perforation or abscess without bleeding: Secondary | ICD-10-CM | POA: Insufficient documentation

## 2023-09-25 DIAGNOSIS — H40009 Preglaucoma, unspecified, unspecified eye: Secondary | ICD-10-CM | POA: Insufficient documentation

## 2023-09-25 DIAGNOSIS — E559 Vitamin D deficiency, unspecified: Secondary | ICD-10-CM | POA: Insufficient documentation

## 2023-09-25 NOTE — Telephone Encounter (Signed)
 Prescription refill request for Eliquis  received. Indication: a fib Last office visit: 03/27/23 Scr:0.8 epic 01/29/23 Age: 83 Weight: 61kg

## 2023-10-14 DIAGNOSIS — M533 Sacrococcygeal disorders, not elsewhere classified: Secondary | ICD-10-CM | POA: Diagnosis not present

## 2023-10-31 DIAGNOSIS — H903 Sensorineural hearing loss, bilateral: Secondary | ICD-10-CM | POA: Diagnosis not present

## 2023-11-11 DIAGNOSIS — R058 Other specified cough: Secondary | ICD-10-CM | POA: Diagnosis not present

## 2023-11-11 DIAGNOSIS — R634 Abnormal weight loss: Secondary | ICD-10-CM | POA: Diagnosis not present

## 2023-11-11 DIAGNOSIS — I480 Paroxysmal atrial fibrillation: Secondary | ICD-10-CM | POA: Diagnosis not present

## 2023-11-11 DIAGNOSIS — Z0000 Encounter for general adult medical examination without abnormal findings: Secondary | ICD-10-CM | POA: Diagnosis not present

## 2023-11-11 DIAGNOSIS — Z5181 Encounter for therapeutic drug level monitoring: Secondary | ICD-10-CM | POA: Diagnosis not present

## 2023-11-11 DIAGNOSIS — R7309 Other abnormal glucose: Secondary | ICD-10-CM | POA: Diagnosis not present

## 2023-11-11 DIAGNOSIS — E7800 Pure hypercholesterolemia, unspecified: Secondary | ICD-10-CM | POA: Diagnosis not present

## 2023-11-11 DIAGNOSIS — E538 Deficiency of other specified B group vitamins: Secondary | ICD-10-CM | POA: Diagnosis not present

## 2023-11-11 DIAGNOSIS — E559 Vitamin D deficiency, unspecified: Secondary | ICD-10-CM | POA: Diagnosis not present

## 2023-12-17 DIAGNOSIS — F4323 Adjustment disorder with mixed anxiety and depressed mood: Secondary | ICD-10-CM | POA: Diagnosis not present

## 2023-12-22 ENCOUNTER — Other Ambulatory Visit: Payer: Self-pay | Admitting: Cardiovascular Disease

## 2023-12-22 DIAGNOSIS — I480 Paroxysmal atrial fibrillation: Secondary | ICD-10-CM

## 2023-12-24 DIAGNOSIS — F4323 Adjustment disorder with mixed anxiety and depressed mood: Secondary | ICD-10-CM | POA: Diagnosis not present

## 2023-12-24 NOTE — Telephone Encounter (Signed)
 Prescription refill request for Eliquis received. Indication:afib Last office visit:12/24 Scr:0.80  10/24 Age: 83 Weight:61.6  kg  Prescription refilled

## 2023-12-31 DIAGNOSIS — H6121 Impacted cerumen, right ear: Secondary | ICD-10-CM | POA: Diagnosis not present

## 2023-12-31 DIAGNOSIS — Z23 Encounter for immunization: Secondary | ICD-10-CM | POA: Diagnosis not present

## 2023-12-31 DIAGNOSIS — H903 Sensorineural hearing loss, bilateral: Secondary | ICD-10-CM | POA: Diagnosis not present

## 2024-01-03 ENCOUNTER — Encounter (INDEPENDENT_AMBULATORY_CARE_PROVIDER_SITE_OTHER): Payer: Self-pay

## 2024-01-15 NOTE — Telephone Encounter (Signed)
 Pt phoned in with left ear pain due to infection. Last seen in office on 04/04/2020. Per Dr. Ethyl Spruce in Rx for cipro  ear drops 4 to 5 drops in left ear for 5 days.

## 2024-01-16 DIAGNOSIS — H04123 Dry eye syndrome of bilateral lacrimal glands: Secondary | ICD-10-CM | POA: Diagnosis not present

## 2024-01-16 DIAGNOSIS — H401131 Primary open-angle glaucoma, bilateral, mild stage: Secondary | ICD-10-CM | POA: Diagnosis not present

## 2024-01-17 DIAGNOSIS — Z01110 Encounter for hearing examination following failed hearing screening: Secondary | ICD-10-CM | POA: Diagnosis not present

## 2024-01-21 NOTE — Telephone Encounter (Signed)
 error

## 2024-02-21 ENCOUNTER — Ambulatory Visit (INDEPENDENT_AMBULATORY_CARE_PROVIDER_SITE_OTHER): Admitting: Otolaryngology

## 2024-02-21 ENCOUNTER — Encounter (INDEPENDENT_AMBULATORY_CARE_PROVIDER_SITE_OTHER): Payer: Self-pay | Admitting: Otolaryngology

## 2024-02-21 VITALS — BP 128/84 | HR 76 | Ht 65.0 in | Wt 132.0 lb

## 2024-02-21 DIAGNOSIS — K219 Gastro-esophageal reflux disease without esophagitis: Secondary | ICD-10-CM | POA: Diagnosis not present

## 2024-02-21 DIAGNOSIS — H6121 Impacted cerumen, right ear: Secondary | ICD-10-CM | POA: Diagnosis not present

## 2024-02-21 DIAGNOSIS — H903 Sensorineural hearing loss, bilateral: Secondary | ICD-10-CM | POA: Diagnosis not present

## 2024-02-21 DIAGNOSIS — H938X1 Other specified disorders of right ear: Secondary | ICD-10-CM

## 2024-02-21 DIAGNOSIS — R0989 Other specified symptoms and signs involving the circulatory and respiratory systems: Secondary | ICD-10-CM

## 2024-02-21 NOTE — Patient Instructions (Signed)
 Reflux gourmet --- seaweed --- buy it on amazon. You have it after meals and before bed. After using for a few (4) weeks, take the omeprazole every other day and see how your symptoms are. If your symptoms continue to do well, then stop the omeprazole completley after 6 weeks

## 2024-02-21 NOTE — Progress Notes (Signed)
 Dear Dr. Chrystal, Here is my assessment for our mutual patient, Amber Mccarthy. Thank you for allowing me the opportunity to care for your patient. Please do not hesitate to contact me should you have any other questions. Sincerely, Dr. Eldora Blanch  Otolaryngology Clinic Note Referring provider: Dr. Chrystal HPI:  Amber Mccarthy is a 83 y.o. female kindly referred by Dr. Chrystal for evaluation of cerumen impaction, hearing loss, right ear fullness and LPR  Initial visit (01/2024): Noted chronic long-standing SNHL for which she wears HA; some tinnitus; right ear fullness attributed to cerumen impaction which is intermittent. Patient denies: ear pain, vertigo, drainage Patient additionally denies: deep pain in ear canal, eustachian tube symptoms such as popping, crackling, sensitive to pressure changes Patient also denies barotrauma, vestibular suppressant use, ototoxic medication use Prior ear surgery: no No significant noise exposure  LPR/GERD - h/o dry chronic cough, throat clearing, saw Dr. Ethyl; no symptoms of GERD currently; voice is without issue. Some feeling of mucus in the back of the throat -- improved after Omeprazole.  Patient otherwise denies: - dysphagia, odynophagia, aspiration episodes or PNA, need for Heimlich, unintentional weight loss - changes in voice, shortness of breath, hemoptysis, ear pain, neck masses  No significant noise exposure  Personal or FHx of bleeding dz or anesthesia difficulty: no  GLP-1: no AP/AC: Eliquis   Tobacco: former, quit PMHx: A-fib  Independent Review of Additional Tests or Records:  Dr. Ethyl 08/25/2020: chronic left external otitis; noted left otitis; doing better after treatment; f/u PRN - prior treated with ciprodex and CSF powder and gentian violet CBC and CMP 01/29/2023: WBC 6.6; BUN/Cr 19/0.78 [facility] Outside hearing test (2024) independenty interpreted: noted b/l SNHL AU; A/A tymps  PMH/Meds/All/SocHx/FamHx/ROS:    Past Medical History:  Diagnosis Date   Colon polyps    PAF (paroxysmal atrial fibrillation) (HCC) 09/2014   a.  Echo 6/16:  Vigorous LVF, EF 65-70%, no RWMA, Gr 1 DD     Past Surgical History:  Procedure Laterality Date   CESAREAN SECTION  1987    Family History  Problem Relation Age of Onset   Cancer Mother        angiosarcoma   Alzheimer's disease Father      Social Connections: Not on file      Current Outpatient Medications:    acetaminophen  (TYLENOL ) 500 MG tablet, Take 500-1,000 mg by mouth every 6 (six) hours as needed for mild pain or moderate pain., Disp: , Rfl:    apixaban  (ELIQUIS ) 5 MG TABS tablet, TAKE 1 TABLET BY MOUTH TWICE A DAY, Disp: 180 tablet, Rfl: 1   ascorbic acid (VITAMIN C) 500 MG tablet, Take 500 mg by mouth 2 (two) times daily., Disp: , Rfl:    cholecalciferol (VITAMIN D3) 25 MCG (1000 UNIT) tablet, Take 1,000 Units by mouth daily., Disp: , Rfl:    dorzolamide-timolol (COSOPT) 22.3-6.8 MG/ML ophthalmic solution, Place 1 drop into both eyes 2 (two) times daily., Disp: , Rfl:    latanoprost (XALATAN) 0.005 % ophthalmic solution, Place 1 drop into both eyes at bedtime., Disp: , Rfl:    metoprolol  tartrate (LOPRESSOR ) 25 MG tablet, TAKE 0.5 TABLETS (12.5 MG TOTAL) BY MOUTH DAILY AS NEEDED (FOR PALPITATIONS)., Disp: 45 tablet, Rfl: 1   pravastatin (PRAVACHOL) 20 MG tablet, Take 20 mg by mouth 2 (two) times a week., Disp: , Rfl:    vitamin B-12 (CYANOCOBALAMIN ) 1000 MCG tablet, Take 1,000 mcg by mouth daily., Disp: , Rfl:    Physical Exam:  BP 128/84 (BP Location: Left Arm, Patient Position: Sitting, Cuff Size: Normal)   Pulse 76   Ht 5' 5 (1.651 m)   Wt 132 lb (59.9 kg)   SpO2 94%   BMI 21.97 kg/m   Salient findings:  CN II-XII intact Given history and complaints, ear microscopy was indicated and performed for evaluation with findings as below in physical exam section and in procedures; right cerumen impaction; after clearance, Bilateral EAC  clear and TM intact with well pneumatized middle ear spaces Anterior rhinoscopy: Septum intact; bilateral inferior turbinates without significant hypertrophy No lesions of oral cavity/oropharynx No obviously palpable neck masses/lymphadenopathy/thyromegaly No respiratory distress or stridor; intermittent throat clearing; TFL was indicated to better evaluate the proximal airway, given the patient's history and exam findings, and is detailed below.   Seprately Identifiable Procedures:  Prior to initiating any procedures, risks/benefits/alternatives were explained to the patient and verbal consent obtained. Procedure: Bilateral ear microscopy and cerumen removal using microscope (CPT [id number]) - Mod 25 Pre-procedure diagnosis: Cerumen impaction right external ears Post-procedure diagnosis: same Indication: right cerumen impaction; given patient's otologic complaints and history as well as for improved and comprehensive examination of external ear and tympanic membrane, bilateral otologic examination using microscope was performed and impacted cerumen removed  Procedure: Patient was placed semi-recumbent. Both ear canals were examined using the microscope with findings above. Impacted Cerumen removed on right using suction and currette with improvement in EAC examination and patency. Patient tolerated the procedure well.   Procedure Note Pre-procedure diagnosis: Chronic throat clearing, LPR Post-procedure diagnosis: Same Procedure: Transnasal Fiberoptic Laryngoscopy, CPT 31575 - Mod 25 Indication: see above Complications: None apparent EBL: 0 mL  The procedure was undertaken to further evaluate the patient's complaint above, with mirror exam inadequate for appropriate examination due to gag reflex and poor patient tolerance  Procedure:  Patient was identified as correct patient. Verbal consent was obtained. The nose was sprayed with oxymetazoline and 4% lidocaine. The The flexible laryngoscope  was passed through the nose to view the nasal cavity, pharynx (oropharynx, hypopharynx) and larynx.  The larynx was examined at rest and during multiple phonatory tasks. Documentation was obtained and reviewed with patient. The scope was removed. The patient tolerated the procedure well.  Findings: The nasal cavity and nasopharynx did not reveal any masses or lesions, mucosa appeared to be without obvious lesions. The tongue base, pharyngeal walls, piriform sinuses, vallecula, epiglottis and postcricoid region are normal in appearance. The visualized portion of the subglottis and proximal trachea is widely patent. The vocal folds are mobile bilaterally. There are no lesions on the free edge of the vocal folds nor elsewhere in the larynx worrisome for malignancy.    Electronically signed by: Eldora KATHEE Blanch, MD 03/01/2024 8:48 PM      Impression & Plans:  Amber Mccarthy is a 83 y.o. female with:  1. Sensorineural hearing loss (SNHL) of both ears   2. Impacted cerumen of right ear   3. Sensation of fullness in right ear   4. Chronic throat clearing   5. Laryngopharyngeal reflux (LPR)    Multiple issues: SNHL: continue amplification; right ear fullness - improved after clearance of cerumen, recommend observation Chronic throat clearing and likely LPR: improved on PPI but she does not wish to be on them long term; discussed options including throat clearing suppression Rx (she'd like to hold off for now), and barrier agent (reflux gourmet). She will try reflux gourmet x4 weeks after lunch and dinner, then can go down on  PPI to every other day. If sx continue to improve, stop omeprazole completely in 4 more weeks and switch to reflux gourmet completely  F/u PRN     Thank you for allowing me the opportunity to care for your patient. Please do not hesitate to contact me should you have any other questions.  Sincerely, Eldora Blanch, MD Otolaryngologist (ENT), [facility] Phone: [phone] Fax: [phone]  03/01/2024, 8:48 PM   MDM:  Level 4 - 99204 Complexity/Problems addressed: mod - multiple chronic problems Data complexity: mod - independent review of note, labs, outside test - Morbidity: mod  - Prescription Drug prescribed or managed: y

## 2024-03-10 DIAGNOSIS — G44209 Tension-type headache, unspecified, not intractable: Secondary | ICD-10-CM | POA: Diagnosis not present

## 2024-03-23 ENCOUNTER — Encounter: Payer: Self-pay | Admitting: Cardiovascular Disease

## 2024-03-23 ENCOUNTER — Ambulatory Visit: Attending: Cardiovascular Disease | Admitting: Cardiovascular Disease

## 2024-03-23 VITALS — BP 122/70 | HR 72 | Ht 65.0 in | Wt 133.2 lb

## 2024-03-23 DIAGNOSIS — D6869 Other thrombophilia: Secondary | ICD-10-CM | POA: Diagnosis not present

## 2024-03-23 DIAGNOSIS — I480 Paroxysmal atrial fibrillation: Secondary | ICD-10-CM | POA: Diagnosis not present

## 2024-03-23 DIAGNOSIS — R0602 Shortness of breath: Secondary | ICD-10-CM | POA: Diagnosis not present

## 2024-03-23 NOTE — Progress Notes (Unsigned)
 Cardiology Office Note:    Date:  03/24/2024   ID:  Amber Mccarthy, DOB Jun 05, 1940, MRN [id number]  PCP:  Chrystal Lamarr RAMAN, MD  Cardiologist:  Jerel Balding, MD    Referring MD: Chrystal Lamarr RAMAN, *   Chief complaint: atrial fibrillation  History of Present Illness:    Amber Mccarthy is a 83 y.o. female with a hx of infrequent episodes of paroxysmal atrial fibrillation.  She required cardioversion in 2020, but subsequent events have resolved spontaneously.  She is generally very aware of her arrhythmia and obtains confirmation with her smart watch.  The burden of atrial fibrillation this year seems very similar to the year before.  She has had 4 events in the in 2025: 1 hour episode in May, 3-1/2-hour episode in August, 4-1/2 episode in October and another 2-hour episode in November.  The spells appeared to occur more frequently at night.  She has palpitations and feels somewhat uneasy, but does not have severe shortness of breath, chest pain or syncope.  The episodes improve after she takes metoprolol  and have always resolved spontaneously.  She had a fall about a year ago where she hit her face and head.  She had scans of her head at that time that showed no evidence of intracranial bleeding.  Off-and-on she has had headaches ever since.  She frequently notices that the headaches occur when she exercises.  She goes to the [facility] 3 times a week and does a combination of 2 miles walking on a treadmill and strength exercises.  She often notices it when she is trying to walk a little faster on the treadmill, which also causes mild dyspnea.  Her only risk factors for embolic events are age and gender. She does not have vascular disease, hypertension diabetes, history of heart failure or stroke/TIA. Echo in 2016 showed normal left ventricular ejection fraction with EF 65-70% in the left atrium was described as normal in size.   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  Her husband Dempsey Aly is also my patient.   Past Medical History:  Diagnosis Date   Colon polyps    PAF (paroxysmal atrial fibrillation) (HCC) 09/2014   a.  Echo 6/16:  Vigorous LVF, EF 65-70%, no RWMA, Gr 1 DD    Past Surgical History:  Procedure Laterality Date   CESAREAN SECTION  1987    Current Medications: Current Meds  Medication Sig   acetaminophen  (TYLENOL ) 500 MG tablet Take 500-1,000 mg by mouth every 6 (six) hours as needed for mild pain or moderate pain.   apixaban  (ELIQUIS ) 5 MG TABS tablet TAKE 1 TABLET BY MOUTH TWICE A DAY   ascorbic acid (VITAMIN C) 500 MG tablet Take 500 mg by mouth 2 (two) times daily.   cholecalciferol (VITAMIN D3) 25 MCG (1000 UNIT) tablet Take 1,000 Units by mouth daily.   dorzolamide-timolol (COSOPT) 22.3-6.8 MG/ML ophthalmic solution Place 1 drop into both eyes 2 (two) times daily.   latanoprost (XALATAN) 0.005 % ophthalmic solution Place 1 drop into both eyes at bedtime.   metoprolol  tartrate (LOPRESSOR ) 25 MG tablet TAKE 0.5 TABLETS (12.5 MG TOTAL) BY MOUTH DAILY AS NEEDED (FOR PALPITATIONS).   pravastatin (PRAVACHOL) 20 MG tablet Take 20 mg by mouth 2 (two) times a week.   vitamin B-12 (CYANOCOBALAMIN ) 1000 MCG tablet Take 1,000 mcg by mouth daily.     Allergies:   Patient has no known allergies.   Family History: The  patient's family history includes Alzheimer's disease in her father; Cancer in her mother. ROS:   Please see the history of present illness.    All other systems are reviewed and are negative.   EKGs/Labs/Other Studies Reviewed:    The following studies were reviewed today:  EKG:  EKG is ordered today and shows normal sinus rhythm, completely normal tracing. Recent Labs: No results found for requested labs within last 365 days.  11/11/2023 hemoglobin A1c 5.4%, hemoglobin 14, creatinine 0.8, potassium 3.9, ALT 14, TSH 1.160 Recent Lipid Panel No results found for: CHOL, TRIG, HDL,  CHOLHDL, VLDL, LDLCALC, LDLDIRECT 11/11/2023 cholesterol 193, HDL 72, LDL 100, triglycerides 119  Physical Exam:    VS:  BP 122/70 (BP Location: Left Arm, Patient Position: Sitting, Cuff Size: Normal)   Pulse 72   Ht 5' 5 (1.651 m)   Wt 133 lb 3.2 oz (60.4 kg)   SpO2 97%   BMI 22.17 kg/m     Wt Readings from Last 3 Encounters:  03/23/24 133 lb 3.2 oz (60.4 kg)  02/21/24 132 lb (59.9 kg)  03/27/23 135 lb 12.8 oz (61.6 kg)      General: Alert, oriented x3, no distress, lean and fit, appears younger than stated age. Head: no evidence of trauma, PERRL, EOMI, no exophtalmos or lid lag, no myxedema, no xanthelasma; normal ears, nose and oropharynx Neck: normal jugular venous pulsations and no hepatojugular reflux; brisk carotid pulses without delay and no carotid bruits Chest: clear to auscultation, no signs of consolidation by percussion or palpation, normal fremitus, symmetrical and full respiratory excursions Cardiovascular: normal position and quality of the apical impulse, regular rhythm, normal first and second heart sounds, no murmurs, rubs or gallops Abdomen: no tenderness or distention, no masses by palpation, no abnormal pulsatility or arterial bruits, normal bowel sounds, no hepatosplenomegaly Extremities: no clubbing, cyanosis or edema; 2+ radial, ulnar and brachial pulses bilaterally; 2+ right femoral, posterior tibial and dorsalis pedis pulses; 2+ left femoral, posterior tibial and dorsalis pedis pulses; no subclavian or femoral bruits Neurological: grossly nonfocal Psych: Normal mood and affect     ASSESSMENT:    1. PAF (paroxysmal atrial fibrillation) (HCC)   2. Shortness of breath     PLAN:    In order of problems listed above:  PAF: CHA2DS2-VASc 3 (age and gender).  As before she has infrequent episodes that are relatively brief, only mildly symptomatic and resolved spontaneously.  Continue anticoagulation.  Arrhythmics and/or ablation did not appear to  be justified at this time, unless the arrhythmia becomes more prevalent and persistent.  If that were the case I would recommend early referral for ablation rather than medications since she does not have any structural heart disease. Anticoagulation: Other than the fall that occurred a year ago she has not had other injuries or bleeding complications.  Continue Eliquis .  We have briefly discussed watchman as an alternative but also discussed the fact that it comes with a finite risk of immediate complications intra procedurally, therefore is more appealing in younger patients rather than octogenarians (even though biologically she is much younger than her age). Headaches during exercise: So long after her fall, I would be very surprised if this had any relationship with a concussion or other type of mechanical injury.  I wonder if she is describing increase in blood pressure during physical exercise.  She has not had a functional cardiac study since 2016.  Will recommend a treadmill stress test, which will also allow us  to evaluate  functional status/exercise capacity.  If there is marked increase in blood pressure during exercise we might recommend her metoprolol  as a daily medication rather than just as needed.  Medication Adjustments/Labs and Tests Ordered: Current medicines are reviewed at length with the patient today.  Concerns regarding medicines are outlined above. Labs and tests ordered and medication changes are outlined in the patient instructions below:  Patient Instructions  Medication Instructions:  No changes *If you need a refill on your cardiac medications before your next appointment, please call your pharmacy*  Lab Work: None ordered If you have labs (blood work) drawn today and your tests are completely normal, you will receive your results only by: MyChart Message (if you have MyChart) OR A paper copy in the mail If you have any lab test that is abnormal or we need to change  your treatment, we will call you to review the results.  Testing/Procedures:   ETT- Patient Instructions for Stress Test  Medication instructions:   Do NOT take your Metoprolol  tartrate before your appointment.   Do not eat, drink or use tobacco products four hours prior to the test.  Water is ok.  3.  Dress prepared to exercise in a comfortable, two piece clothing outfit and walking shoes.  4.  Bring any current prescription medications with you the day of the test.  5.  Notify the office 24 hours in advance if you cannot keep this appointment.  6.  If you have any questions, please call [phone].   Follow-Up: At [facility], you and your health needs are our priority.  As part of our continuing mission to provide you with exceptional heart care, our providers are all part of one team.  This team includes your primary Cardiologist (physician) and Advanced Practice Providers or APPs (Physician Assistants and Nurse Practitioners) who all work together to provide you with the care you need, when you need it.  Your next appointment:   1 year(s)  Provider:   Jerel Balding, MD    We recommend signing up for the patient portal called MyChart.  Sign up information is provided on this After Visit Summary.  MyChart is used to connect with patients for Virtual Visits (Telemedicine).  Patients are able to view lab/test results, encounter notes, upcoming appointments, etc.  Non-urgent messages can be sent to your provider as well.   To learn more about what you can do with MyChart, go to [email or web address].      Signed, Jerel Balding, MD  03/24/2024 3:51 PM    [facility]

## 2024-03-23 NOTE — Patient Instructions (Addendum)
 Medication Instructions:  No changes *If you need a refill on your cardiac medications before your next appointment, please call your pharmacy*  Lab Work: None ordered If you have labs (blood work) drawn today and your tests are completely normal, you will receive your results only by: MyChart Message (if you have MyChart) OR A paper copy in the mail If you have any lab test that is abnormal or we need to change your treatment, we will call you to review the results.  Testing/Procedures:   ETT- Patient Instructions for Stress Test  Medication instructions:   Do NOT take your Metoprolol  tartrate before your appointment.   Do not eat, drink or use tobacco products four hours prior to the test.  Water is ok.  3.  Dress prepared to exercise in a comfortable, two piece clothing outfit and walking shoes.  4.  Bring any current prescription medications with you the day of the test.  5.  Notify the office 24 hours in advance if you cannot keep this appointment.  6.  If you have any questions, please call [phone].   Follow-Up: At [facility], you and your health needs are our priority.  As part of our continuing mission to provide you with exceptional heart care, our providers are all part of one team.  This team includes your primary Cardiologist (physician) and Advanced Practice Providers or APPs (Physician Assistants and Nurse Practitioners) who all work together to provide you with the care you need, when you need it.  Your next appointment:   1 year(s)  Provider:   Jerel Balding, MD    We recommend signing up for the patient portal called MyChart.  Sign up information is provided on this After Visit Summary.  MyChart is used to connect with patients for Virtual Visits (Telemedicine).  Patients are able to view lab/test results, encounter notes, upcoming appointments, etc.  Non-urgent messages can be sent to your provider as well.   To learn more about what you can do  with MyChart, go to [email or web address].

## 2024-03-24 ENCOUNTER — Encounter: Payer: Self-pay | Admitting: Cardiovascular Disease

## 2024-03-26 ENCOUNTER — Encounter (HOSPITAL_COMMUNITY): Payer: Self-pay | Admitting: *Deleted

## 2024-03-31 ENCOUNTER — Telehealth (HOSPITAL_COMMUNITY): Payer: Self-pay

## 2024-03-31 NOTE — Telephone Encounter (Signed)
 Patient called back with questions about her stress test. They were answered ans she will be here for her test on Friday 04/03/24. S.Latorsha Curling CCT

## 2024-03-31 NOTE — Telephone Encounter (Signed)
 Detailed instructions left on her answering machine. S.Leeam Cedrone CCT

## 2024-04-03 ENCOUNTER — Ambulatory Visit: Payer: Self-pay | Admitting: Cardiovascular Disease

## 2024-04-03 ENCOUNTER — Ambulatory Visit (HOSPITAL_COMMUNITY)
Admission: RE | Admit: 2024-04-03 | Discharge: 2024-04-03 | Disposition: A | Source: Ambulatory Visit | Attending: Cardiovascular Disease | Admitting: Cardiovascular Disease

## 2024-04-03 DIAGNOSIS — R0602 Shortness of breath: Secondary | ICD-10-CM

## 2024-04-03 LAB — EXERCISE TOLERANCE TEST
Angina Index: 0
Base ST Depression (mm): 0 mm
Estimated workload: 7
Exercise duration (min): 6 min
Exercise duration (sec): 1 s
MPHR: 138 {beats}/min
Peak HR: 127 {beats}/min
Percent HR: 92 %
RPE: 18
Rest HR: 81 {beats}/min
